# Patient Record
Sex: Female | Born: 1980 | Race: White | Hispanic: Yes | Marital: Married | Smoking: Never smoker
Health system: Southern US, Community
[De-identification: ages and names within clinical notes are randomized; demographics above are authoritative.]

## PROBLEM LIST (undated history)

## (undated) DIAGNOSIS — R1012 Left upper quadrant pain: Secondary | ICD-10-CM

## (undated) DIAGNOSIS — K219 Gastro-esophageal reflux disease without esophagitis: Secondary | ICD-10-CM

## (undated) HISTORY — PX: TUBAL LIGATION: SHX77

## (undated) HISTORY — PX: CHOLECYSTECTOMY: SHX55

## (undated) HISTORY — DX: Left upper quadrant pain: R10.12

---

## 2005-03-11 ENCOUNTER — Inpatient Hospital Stay (HOSPITAL_COMMUNITY): Admission: AD | Admit: 2005-03-11 | Discharge: 2005-03-13 | Payer: Self-pay | Admitting: Obstetrics and Gynecology

## 2005-04-19 ENCOUNTER — Emergency Department (HOSPITAL_COMMUNITY): Admission: EM | Admit: 2005-04-19 | Discharge: 2005-04-19 | Payer: Self-pay | Admitting: Emergency Medicine

## 2005-04-21 ENCOUNTER — Inpatient Hospital Stay (HOSPITAL_COMMUNITY): Admission: RE | Admit: 2005-04-21 | Discharge: 2005-04-22 | Payer: Self-pay | Admitting: Emergency Medicine

## 2005-04-21 ENCOUNTER — Encounter (INDEPENDENT_AMBULATORY_CARE_PROVIDER_SITE_OTHER): Payer: Self-pay | Admitting: General Surgery

## 2006-04-22 ENCOUNTER — Observation Stay (HOSPITAL_COMMUNITY): Admission: AD | Admit: 2006-04-22 | Discharge: 2006-04-22 | Payer: Self-pay | Admitting: Obstetrics and Gynecology

## 2006-08-25 ENCOUNTER — Inpatient Hospital Stay (HOSPITAL_COMMUNITY): Admission: AD | Admit: 2006-08-25 | Discharge: 2006-08-26 | Payer: Self-pay | Admitting: Obstetrics and Gynecology

## 2008-02-21 ENCOUNTER — Emergency Department (HOSPITAL_COMMUNITY): Admission: EM | Admit: 2008-02-21 | Discharge: 2008-02-21 | Payer: Self-pay | Admitting: Emergency Medicine

## 2011-01-05 NOTE — Consult Note (Signed)
NAMEPAULLETTE, MCKAIN NO.:  [id number]   MEDICAL RECORD NO.:  [id number]          PATIENT TYPE:  OIB   LOCATION:  LDR2                          FACILITY:  [facility]   PHYSICIAN:  Tilda Burrow, M.D. DATE OF BIRTH:  02/11/1981   DATE OF CONSULTATION:  04/22/2006  DATE OF DISCHARGE:                                   CONSULTATION   OBSERVATION NOTE:  This 30 year old Hispanic female with no prenatal care to  date, is allegedly due September 03, 2006.  She presented with left-sided  abdominal pain.  Medical issues notable for gallstones diagnosed 1 years ago  and subsequently treated with cholecystectomy.  The patient is now  approximately [redacted] weeks pregnant.  Exam shows a nontender abdomen, and fetal  heart rate is reactive.  Cervical exam by nurse shows cervix to be closed  and normal.  The pain is in the left side and goes around into the back.  Urinalysis is completely normal.  Ultrasound shows normal bowel peristalsis,  no evidence of hydroureter on either side, no suspicion of ureteral  dilation.  Patient vomited x2 then felt better.   IMPRESSION:  Gastroenteritis  Follow up one week, [facility].  Rx: Phenergan Tabs25mg  x 10      Tilda Burrow, M.D.  Electronically Signed     JVF/MEDQ  D:  04/22/2006  T:  04/22/2006  Job:  [id number]

## 2011-01-05 NOTE — Op Note (Signed)
NAMEMARICELA, SCHREUR NO.:  [id number]   MEDICAL RECORD NO.:  [id number]          PATIENT TYPE:  INP   LOCATION:  A413                          FACILITY:  [facility]   PHYSICIAN:  Tilda Burrow, M.D. DATE OF BIRTH:  02-13-1981   DATE OF PROCEDURE:  08/25/2006  DATE OF DISCHARGE:                                PROCEDURE NOTE   DELIVERY NOTE.   [name] progressed beautifully through the evening.  She received 1  mg of Stadol for headache and otherwise labored spontaneously without  interventions.  She reached completely dilated shortly before 6 a.m.  Developed the urge to push at approximately 6:15, had membrane rupture  revealing a small amount of amniotic fluid, only a few tablespoons, and  then delivered, after about 6 pushes, delivering a healthy female  infant, Apgars 9/9, over an intact perineum with only first degree  lacerations noted.  Placenta delivered intact, Schultze presentation.  Three vessel cord confirmed.  Blood samples obtained from baby.  Blood  gas declined, blood gas not considered necessary.      Tilda Burrow, M.D.  Electronically Signed     JVF/MEDQ  D:  08/25/2006  T:  08/25/2006  Job:  [id number]   cc:   Francoise Schaumann. Milford Cage DO, FAAP  Fax: [phone]

## 2011-01-05 NOTE — Op Note (Signed)
[name], Summer Hughes               ACCOUNT NO.:  [id number]   MEDICAL RECORD NO.:  [id number]          PATIENT TYPE:  OBV   LOCATION:  A428                          FACILITY:  [facility]   PHYSICIAN:  Barbaraann Barthel, M.D. DATE OF BIRTH:  [birth year]   DATE OF PROCEDURE:  04/21/2005  DATE OF DISCHARGE:                                 OPERATIVE REPORT   PREOPERATIVE DIAGNOSIS:  Cholecystitis, cholelithiasis   POSTOPERATIVE DIAGNOSIS:  Cholecystitis, cholelithiasis   PROCEDURE:  Laparoscopic cholecystectomy.   SPECIMEN:  Gallbladder with stones.   NOTE:  This is a 30 year old [name] who presented with acute  gallbladder like symptoms with nausea and vomiting and right upper quadrant  pain. She was seen in the emergency room with elevated white count and  returned for sonogram which revealed multiple stones. Liver function studies  and amylase were within normal limits. She was admitted. She was taken to  surgery after explaining to her in detail in Spanish the complications not  limited to but including bleeding, infection, damage to bile duct,  perforation of organs and transitory diarrhea. Informed consent was  obtained.   GROSS OPERATIVE FINDINGS:  Adhesions about the gallbladder. Multiple small  stones within the gallbladder. The right upper quadrant otherwise appeared  to be normal. Cholangiogram was not performed.   TECHNIQUE:  The patient was placed in the supine position. After the  adequate administration of general anesthesia via endotracheal intubation,  her entire was prepped with Betadine solution and draped in the usual  manner. Foley catheter was aseptically inserted. A periumbilical incision  was carried out on the superior aspect of the umbilicus. The fascia was  visualized and elevated with  sharp towel clip and with the patient in  Trendelenburg. A Veress needle was inserted and confirmed in position with a  saline drop test. The abdomen was then insufflated  with approximately 3.5  liters of CO2, and 11-mm cannula was then inserted using the Visiport  technique, and then under direct vision, another 11-mm cannula was placed in  the epigastrium, and two 5-mm cannulas were placed in the right upper  quadrant laterally. The gallbladder was grasped. Its adhesions were taken  down. The cystic duct was clearly visualized, triply silver clipped and  divided as was the cystic artery. The gallbladder was then removed from the  liver bed using a hook cautery device. We then checked for hemostasis and  elected to leave a piece of Surgicel in the liver bed and then placed a  Jackson-Pratt drain in the liver bed. This was exited through the lateral  most 5-mm cannula site. The incision in the epigastrium and the umbilicus  were closed with 0 Polysorb sutures, and 0.5% Sensorcaine was used in all  port sites to help with postoperative comfort. The skin incisions were then  closed with  stapling device, and the drain was sutured in place with 3-0 nylon. Prior to  closure, all sponge, needle and instrument counts were found to be correct.  Estimated blood loss was minimal. The patient received a liter of  crystalloids intraoperatively. There were no complications.  Barbaraann Barthel, M.D.  Electronically Signed     WB/MEDQ  D:  04/21/2005  T:  04/21/2005  Job:  [id number]

## 2011-01-05 NOTE — Consult Note (Signed)
Summer Hughes, Summer Hughes NO.:  [id number]   MEDICAL RECORD NO.:  [id number]          PATIENT TYPE:  OBV   LOCATION:  A428                          FACILITY:  [facility]   PHYSICIAN:  Barbaraann Barthel, M.D. DATE OF BIRTH:  09/16/1980   DATE OF CONSULTATION:  04/20/2005  DATE OF DISCHARGE:                                   CONSULTATION   Surgery was asked to see this 30 year old Summer Hughes female who was admitted  through the emergency room with right upper quadrant pain and nausea. She  gave the history of having right upper quadrant pain and nausea in the past;  at least six months ago, she had similar type symptoms. She was seen in the  emergency room yesterday, and she was returned after sonogram revealed  multiple gallstones. Surgery was consulted, and the patient was admitted via  the emergency room for observation and for surgery during this admission.   PHYSICAL EXAMINATION:  GENERAL:  She weighs 140 pounds. She is approximately  5 foot 1 inch tall. Her temperature is 98.0, blood pressure 123/64, heart  rate 68, respirations 20 per minute.  HEENT:  Head is normocephalic. Eyes:  Extraocular movements are intact.  Pupils are round and reactive to light and accommodation. There is no  conjunctival pallor or scleral injection. Sclerae are a normal tincture.  Nose and oral mucosa are moist.  NECK:  Supple and cylindrical without jugular venous distention,  thyromegaly, or tracheal deviation. No bruits are auscultated. No cervical  adenopathy.  CHEST:  Clear both to anterior and posterior auscultation.  HEART:  Regular rhythm.  BREASTS:  Without masses. The patient is still breast feeding her 52-month-  old child.  ABDOMEN:  Tender over the right upper quadrant. Bowel sounds are  normoactive. No femoral or inguinal hernias are appreciated.  RECTAL:  Guaiac negative stool.  EXTREMITIES:  Without normal limits.   REVIEW OF SYSTEMS:  ENDOCRINE:  No history of diabetes or  thyroid disease.  OB/GYN HISTORY:  She is a gravida 2, para 2, abortus 0, Cesarean 0 female  who is still breast feeding her 33-month-old infant. No past history of  carcinoma of the breast in the family. GASTROINTESTINAL:  Nausea, vomiting,  right upper quadrant post prandial pain. She had this back in [place]. It was  thought that she might have an ulcer at that time. She was treated  symptomatically for this. She, however, underwent no endoscopy and no other  diagnostic studies. No past history of hepatitis. No past history of bright  red rectal bleeding, black tarry stools, or explained weight loss.  GENITOURINARY:  No history of nephrolithiasis or dysuria. MUSCULOSKELETAL:  Within normal limits.   LABORATORY DATA:  The patient had a white count of 13,000 in the emergency  room yesterday. White count is down to 7.9 with a H and H of 11.5 and 33.6  with 54 neutrophils noted, down from 76% yesterday. Metabolic 7 is grossly  within normal limits. Bilirubin and alkaline phosphatase are within normal  limits. AST is 30, within normal limits; SGOT is also within normal limits,  and amylase and lipase are not elevated. Sonogram shows numerous stones  within the gallbladder. The gallbladder wall did not appear to be thickened.  No fluid was discovered. No other abnormalities. Extensive cholelithiasis  was noted.   ASSESSMENT:  We discussed the need for admission and for surgery in this  patient in Spanish in detail. We discussed complications not limited to but  including bleeding, infection, damage to bile ducts, perforation of organs,  transitory diarrhea, and the possibility that an open cholecystectomy may be  required. Informed consent was obtained. All questions were answered in  Spanish, and informed consent was obtained.   PLAN:  This patient will be admitted, hydrated. We will permit full liquid  diet and n.p.o. after midnight and continue Rocephin and plan for surgery in   a.m.      Barbaraann Barthel, M.D.  Electronically Signed     WB/MEDQ  D:  04/20/2005  T:  04/20/2005  Job:  [id number]   cc:   Colon Branch, M.D.  Emergency room

## 2011-01-05 NOTE — Group Therapy Note (Signed)
NAMEMAHREEN, SCHEWE NO.:  [id number]   MEDICAL RECORD NO.:  [id number]          PATIENT TYPE:  INP   LOCATION:  LDR1                          FACILITY:  [facility]   PHYSICIAN:  Tilda Burrow, M.D. DATE OF BIRTH:  29-Dec-1980   DATE OF PROCEDURE:  DATE OF DISCHARGE:                                   PROGRESS NOTE   DELIVERY SUMMARY  Onset of labor is March 12, 2005 at 5:30 a.m.   Date of delivery:  July 24 at 11:30 a.m.   Length of first stage of labor:  5 minutes.  Length of second stage labor:  36 minutes.  Length of third stage labor:  8 minutes.   DELIVERY NOTE:  Chantea had a normal spontaneous delivery of a female infant.  Upon delivery of head, a nuchal cord was noted, which was loosened.  There  was a mild shoulder dystocia which resolved easily with _Mc_ Su Hilt  procedure and a wood screw procedure.  Infant was rotated to the patient's  left with resolution and spontaneous delivery of the infant.  Upon delivery,  the infant was thoroughly suction, dried and passed off to the nursery  nurses for newborn care.  The infant had terminal meconium at delivery.  The  third stage of labor was actively managed with 20 units Pitocin, 1000 mL of  D5 LR at a rapid rate.  Upon inspection, perineum is noted to be intact.  The placenta was delivered spontaneously via Schultz's mechanism.  A three  vessel cord is noted upon inspection.  Membranes were noted to be intact  upon inspection.  Infant Apgars were at 7 and 9.  Cord blood gas was  obtained and sent to respiratory therapy.  Infant and mother both stabilized  and transferred out to the postpartum unit in stable condition.  The  perineum was noted to be intact upon inspection.       DL/MEDQ  D:  29/52/8413  T:  03/12/2005  Job:  [id number]

## 2011-01-05 NOTE — H&P (Signed)
Summer Hughes, Summer Hughes NO.:  [id number]   MEDICAL RECORD NO.:  [id number]          PATIENT TYPE:  INP   LOCATION:  A413                          FACILITY:  [facility]   PHYSICIAN:  Tilda Burrow, M.D. DATE OF BIRTH:  1981/01/13   DATE OF ADMISSION:  08/24/2006  DATE OF DISCHARGE:  LH                              HISTORY & PHYSICAL   ADMITTING DIAGNOSIS:  Pregnancy term, active labor.   HPI:  This is a 30 year old Hispanic female gravid 3, para 1, AB 1 with  late prenatal care seen in a total of 6 visits with uncomplicated  pregnancy course to date.  Presents early on August 25, 2006, shortly  after midnight (complaining of active labor).  Cervical dilation to 3-4  cm is noted.  She is admitted for labor management.  Membranes are  intact.  Fetal heart rate tracing is reactive.   PRENATAL LABS:  Blood type O positive, Rubella immunity present.  Hemoglobin 12, hematocrit 36.  Hepatitis, HIV, RPR, GC and Chlamydia all  negative and Group B Strep is negative.  She is positive for HSV I and  II, was given samples in December of Valtrex.  __GBS  __ negative and glucose tolerance test 115 mg%.  She is admitted for  labor management.   PHYSICAL EXAM:  Weight 155, blood pressure 122/78.  Height 5 feet 0  inches.  Term sized fetus, vertex presentation.   PLAN:  Good prognosis for vaginal delivery.      Tilda Burrow, M.D.  Electronically Signed     JVF/MEDQ  D:  08/25/2006  T:  08/25/2006  Job:  [id number]   cc:   Francoise Schaumann. Milford Cage DO, FAAP  Fax: [phone]

## 2011-01-05 NOTE — Discharge Summary (Signed)
Summer Hughes, Summer Hughes               ACCOUNT NO.:  [id number]   MEDICAL RECORD NO.:  [id number]          PATIENT TYPE:  INP   LOCATION:  A428                          FACILITY:  [facility]   PHYSICIAN:  Barbaraann Barthel, M.D. DATE OF BIRTH:  [birth year]   DATE OF ADMISSION:  04/20/2005  DATE OF DISCHARGE:  09/03/2006LH                                 DISCHARGE SUMMARY   DIAGNOSIS:  Acute cholecystitis.   PROCEDURE:  On April 21, 2005, laparoscopic cholecystectomy.   HOSPITAL COURSE:  Note this is a 30 year old [name] who was admitted  with acute appendicitis after being seen in the emergency room initially and  followed up there and was noted to have multiple stones within the  gallbladder and increased abdominal pain. She was admitted late Friday and  operated on on Saturday morning the following day. A laparoscopic procedure  was carried out, this was uneventful, she had numerous small stones within  the gallbladder, the gallbladder was actually minimally inflamed however it  did have some adhesions about it. The laparoscopic procedure was carried out  uneventfully. The patient postoperatively did quite well, her diet and  activity was advanced as tolerated, there was minimal JP drainage and she  was discharged on the first postoperative day on April 22, 2005. At the  time of discharge she was tolerating p.o. well, she had no shortness of  breath or leg pain, she had no dysuria and her wound was clean without signs  of infection.   LABORATORY DATA:  Ultrasound showed extensive cholelithiasis; the  gallbladder wall was not thickened. Her liver function studies were grossly  within normal limits; postoperatively as well her liver function studies  were grossly within normal limits. Her white count was 13.3 on admission; at  time of discharge her white count was 7.2 with an H&H of 11.5 and 33.7. Her  liver function studies were within normal limits.   DISCHARGE INSTRUCTIONS:   She is discharged on a full liquid and soft diet.  She is excused from work. She is permitted to go indoors and outdoors  without problem. She is excused from work. She is told to do no heavy  lifting and she is to refrain from doing any heavy lifting or sexual  activity. She may continue to breastfeed. She is discharged on Tylenol for  pain and if that is not sufficient Darvocet was given one tablet p.o. q.4 h.  p.r.n. She is told stay away from aspirin products. She is told to clean her  wound with alcohol three times a day. We have made follow-up arrangements to  see her perioperatively. She is told if she has any acute changes to come to  the emergency room or to call me.      Barbaraann Barthel, M.D.  Electronically Signed     WB/MEDQ  D:  04/22/2005  T:  04/22/2005  Job:  [id number]

## 2012-12-29 ENCOUNTER — Other Ambulatory Visit (HOSPITAL_COMMUNITY): Payer: Self-pay | Admitting: General Surgery

## 2012-12-29 DIAGNOSIS — R102 Pelvic and perineal pain: Secondary | ICD-10-CM

## 2013-01-01 ENCOUNTER — Ambulatory Visit (HOSPITAL_COMMUNITY)
Admission: RE | Admit: 2013-01-01 | Discharge: 2013-01-01 | Disposition: A | Discharge status: Home / Self Care | Payer: Self-pay | Source: Ambulatory Visit | Attending: General Surgery | Admitting: General Surgery

## 2013-01-01 DIAGNOSIS — N949 Unspecified condition associated with female genital organs and menstrual cycle: Secondary | ICD-10-CM | POA: Insufficient documentation

## 2013-01-01 DIAGNOSIS — R102 Pelvic and perineal pain: Secondary | ICD-10-CM

## 2013-03-24 ENCOUNTER — Encounter (HOSPITAL_COMMUNITY): Payer: Self-pay | Admitting: *Deleted

## 2013-03-24 ENCOUNTER — Emergency Department (HOSPITAL_COMMUNITY)
Admission: EM | Admit: 2013-03-24 | Discharge: 2013-03-24 | Disposition: A | Discharge status: Home / Self Care | Payer: Managed Care, Other (non HMO) | Attending: Emergency Medicine | Admitting: Emergency Medicine

## 2013-03-24 ENCOUNTER — Emergency Department (HOSPITAL_COMMUNITY): Payer: Managed Care, Other (non HMO)

## 2013-03-24 DIAGNOSIS — R0602 Shortness of breath: Secondary | ICD-10-CM | POA: Insufficient documentation

## 2013-03-24 DIAGNOSIS — R209 Unspecified disturbances of skin sensation: Secondary | ICD-10-CM | POA: Insufficient documentation

## 2013-03-24 DIAGNOSIS — Z3202 Encounter for pregnancy test, result negative: Secondary | ICD-10-CM | POA: Insufficient documentation

## 2013-03-24 DIAGNOSIS — R079 Chest pain, unspecified: Secondary | ICD-10-CM | POA: Insufficient documentation

## 2013-03-24 DIAGNOSIS — R5381 Other malaise: Secondary | ICD-10-CM | POA: Insufficient documentation

## 2013-03-24 DIAGNOSIS — T413X5A Adverse effect of local anesthetics, initial encounter: Secondary | ICD-10-CM | POA: Insufficient documentation

## 2013-03-24 LAB — BASIC METABOLIC PANEL
BUN: 10 mg/dL (ref 6–23)
Chloride: 102 mEq/L (ref 96–112)
Creatinine, Ser: 0.48 mg/dL — ABNORMAL LOW (ref 0.50–1.10)
GFR calc Af Amer: >90 mL/min (ref >90)
Glucose, Bld: 116 mg/dL — ABNORMAL HIGH (ref 70–99)

## 2013-03-24 LAB — URINALYSIS, ROUTINE W REFLEX MICROSCOPIC
Bilirubin Urine: NEGATIVE
Ketones, ur: NEGATIVE mg/dL
Protein, ur: NEGATIVE mg/dL

## 2013-03-24 LAB — CBC WITH DIFFERENTIAL/PLATELET
Basophils Absolute: 0 10*3/uL (ref 0.0–0.1)
Basophils Relative: 0 % (ref 0–1)
HCT: 36.1 % (ref 36.0–46.0)
Hemoglobin: 12.3 g/dL (ref 12.0–15.0)
Lymphocytes Relative: 37 % (ref 12–46)
Lymphs Abs: 4 10*3/uL (ref 0.7–4.0)
MCH: 29.5 pg (ref 26.0–34.0)
MCV: 86.6 fL (ref 78.0–100.0)
Neutrophils Relative %: 57 % (ref 43–77)
Platelets: 233 10*3/uL (ref 150–400)
RDW: 12.9 % (ref 11.5–15.5)
WBC: 10.8 10*3/uL — ABNORMAL HIGH (ref 4.0–10.5)

## 2013-03-24 MED ORDER — IBUPROFEN 400 MG PO TABS
400.0000 mg | ORAL_TABLET | Freq: Once | ORAL | Status: AC
  Administered 2013-03-24: 400 mg via ORAL
  Filled 2013-03-24: qty 1

## 2013-03-24 NOTE — ED Notes (Signed)
Patient given discharge instruction, verbalized understand. IV removed, band aid applied. Patient ambulatory out of the department with husband.  

## 2013-03-24 NOTE — ED Notes (Signed)
Pt received from EMS via [facility] Department secondary to heart palpitations due to lidocaine administration during a dental procedure.

## 2013-03-24 NOTE — ED Provider Notes (Signed)
CSN: [id number]     Arrival date & time 03/24/13  1638 History  This chart was scribed for Flint Melter, MD by Bennett Scrape, ED Scribe. This patient was seen in room APA05/APA05 and the patient's care was started at 5:18 PM.   Chief Complaint  Patient presents with  . Palpitations  . Allergic Reaction    Patient is a 32 y.o. female presenting with palpitations. The history is provided by the patient. A language interpreter was used (pt's husband ).  Palpitations Timing:  Constant Progression:  Resolved Chronicity:  New Context comment:  Dental surgery Associated symptoms: chest pain   Associated symptoms: no vomiting     HPI Comments: Summer Hughes is a 32 y.o. female brought in by ambulance, who presents to the Emergency Department complaining of heart palpitations with associated SOB and fatigue that started after getting a lidocaine injection during a dental procedure. She states that she had one tooth pulled from the right upper jaw today with minimal bleeding. She c/o mild right arm numbness and left CP currently. She rates the pain a 6 out of 10. Pt denies having prior episodes of similar symptoms. She denies being on any daily medications. She denies being on birth control pills. LNMP was 03/05/13.  PMFH: None  History  Substance Use Topics  . Smoking status: Never Smoker   . Smokeless tobacco: Not on file  . Alcohol Use: No   No OB history provided.  Review of Systems  Constitutional: Positive for fatigue.  HENT: Positive for dental problem.   Cardiovascular: Positive for chest pain and palpitations.  Gastrointestinal: Negative for vomiting and diarrhea.  All other systems reviewed and are negative.    Allergies  Review of patient's allergies indicates no known allergies.  Home Medications  No current outpatient prescriptions on file.  Triage Vitals: BP 103/62  Pulse 80  Temp(Src) 99.4 F (37.4 C) (Oral)  Ht 5' (1.524 m)  Wt 161 lb (73.029 kg)  BMI  31.44 kg/m2  SpO2 100%  Physical Exam  Nursing note and vitals reviewed. Constitutional: She is oriented to person, place, and time. She appears well-developed and well-nourished.  HENT:  Head: Normocephalic and atraumatic.  Right upper first molar dental extraction site has a mild amount of blooding, no trismus   Eyes: Conjunctivae and EOM are normal. Pupils are equal, round, and reactive to light.  Neck: Normal range of motion and phonation normal. Neck supple.  Cardiovascular: Normal rate, regular rhythm and intact distal pulses.   Pulmonary/Chest: Effort normal and breath sounds normal. She exhibits no tenderness.  Abdominal: Soft. She exhibits no distension. There is no tenderness. There is no guarding.  Musculoskeletal: Normal range of motion.  Neurological: She is alert and oriented to person, place, and time. She has normal strength. She exhibits normal muscle tone.  Skin: Skin is warm and dry.  Psychiatric: She has a normal mood and affect. Her behavior is normal. Judgment and thought content normal.    ED Course   Procedures (including critical care time)  Medications  ibuprofen (ADVIL,MOTRIN) tablet 400 mg (400 mg Oral Given 03/24/13 1813)   Patient Vitals for the past 24 hrs:  BP Temp Temp src Pulse SpO2 Height Weight  03/24/13 1759 111/70 mmHg - - 75 - - -  03/24/13 1757 111/63 mmHg - - 78 - - -  03/24/13 1755 105/62 mmHg - - 79 - - -  03/24/13 1643 103/62 mmHg 99.4 F (37.4 C) Oral 80 100 %  5' (1.524 m) 161 lb (73.029 kg)   DIAGNOSTIC STUDIES: Oxygen Saturation is 100% on room air, normal by my interpretation.    COORDINATION OF CARE: 5:23 PM-Discussed treatment plan which includes CXR, CBC panel and CMP with pt at bedside and pt agreed to plan.  7:11 PM-Pt rechecked and feels improved with medications listed above. Informed pt of negative work up. Discussed discharge plan with pt and pt agreed to plan. Also advised pt to follow up as needed and pt agreed.  Addressed symptoms to return for with pt.    Date: 03/24/2013  Rate: 78  Rhythm: normal sinus rhythm  QRS Axis: normal  Intervals: normal  ST/T Wave abnormalities: normal  Conduction Disutrbances:none  Narrative Interpretation:   Old EKG Reviewed: none available  Results for orders placed during the hospital encounter of 03/24/13  URINALYSIS, ROUTINE W REFLEX MICROSCOPIC      Result Value Range   Color, Urine YELLOW  YELLOW   APPearance CLEAR  CLEAR   Specific Gravity, Urine 1.015  1.005 - 1.030   pH >9.0 (*) 5.0 - 8.0   Glucose, UA NEGATIVE  NEGATIVE mg/dL   Hgb urine dipstick NEGATIVE  NEGATIVE   Bilirubin Urine NEGATIVE  NEGATIVE   Ketones, ur NEGATIVE  NEGATIVE mg/dL   Protein, ur NEGATIVE  NEGATIVE mg/dL   Urobilinogen, UA 0.2  0.0 - 1.0 mg/dL   Nitrite NEGATIVE  NEGATIVE   Leukocytes, UA NEGATIVE  NEGATIVE  CBC WITH DIFFERENTIAL      Result Value Range   WBC 10.8 (*) 4.0 - 10.5 K/uL   RBC 4.17  3.87 - 5.11 MIL/uL   Hemoglobin 12.3  12.0 - 15.0 g/dL   HCT 40.9  81.1 - 91.4 %   MCV 86.6  78.0 - 100.0 fL   MCH 29.5  26.0 - 34.0 pg   MCHC 34.1  30.0 - 36.0 g/dL   RDW 78.2  95.6 - 21.3 %   Platelets 233  150 - 400 K/uL   Neutrophils Relative % 57  43 - 77 %   Lymphocytes Relative 37  12 - 46 %   Monocytes Relative 5  3 - 12 %   Eosinophils Relative 1  0 - 5 %   Basophils Relative 0  0 - 1 %   Neutro Abs 6.2  1.7 - 7.7 K/uL   Lymphs Abs 4.0  0.7 - 4.0 K/uL   Monocytes Absolute 0.5  0.1 - 1.0 K/uL   Eosinophils Absolute 0.1  0.0 - 0.7 K/uL   Basophils Absolute 0.0  0.0 - 0.1 K/uL   Smear Review MORPHOLOGY UNREMARKABLE    BASIC METABOLIC PANEL      Result Value Range   Sodium 137  135 - 145 mEq/L   Potassium 3.4 (*) 3.5 - 5.1 mEq/L   Chloride 102  96 - 112 mEq/L   CO2 25  19 - 32 mEq/L   Glucose, Bld 116 (*) 70 - 99 mg/dL   BUN 10  6 - 23 mg/dL   Creatinine, Ser 0.86 (*) 0.50 - 1.10 mg/dL   Calcium 9.6  8.4 - 57.8 mg/dL   GFR calc non Af Amer >90  >90 mL/min    GFR calc Af Amer >90  >90 mL/min  PREGNANCY, URINE      Result Value Range   Preg Test, Ur NEGATIVE  NEGATIVE   Dg Chest 2 View  03/24/2013   *RADIOLOGY REPORT*  Clinical Data: Palpitations.  Allergic reaction today.  CHEST - 2 VIEW  Comparison: Portable examination 02/21/2008.  Findings: The heart size and mediastinal contours are stable.  The lungs are well aerated and clear.  There is no pleural effusion or pneumothorax.  No acute osseous findings are demonstrated.  IMPRESSION: No active cardiopulmonary process.   Original Report Authenticated By: Carey Bullocks, M.D.    1. Chest pain     MDM  Nonspecific chest pain, post dental procedure. No evidence for significant allergic reaction, acute bleeding, pneumonia, pneumothorax, or suspected PE.Doubt metabolic instability, serious bacterial infection or impending vascular collapse; the patient is stable for discharge.  Nursing Notes Reviewed/ Care Coordinated, and agree without changes. Applicable Imaging Reviewed.  Interpretation of Laboratory Data incorporated into ED treatment   Plan: Home Medications-  ibuprofen ; Home Treatments and Observation-  rest, watch for progressive symptoms ; return here if the recommended treatment, does not improve the symptoms; Recommended follow up-  return here if needed for problems     I personally performed the services described in this documentation, which was scribed in my presence. The recorded information has been reviewed and is accurate.      Flint Melter, MD 03/25/13 [id number]

## 2014-03-26 ENCOUNTER — Emergency Department (HOSPITAL_COMMUNITY)
Admission: EM | Admit: 2014-03-26 | Discharge: 2014-03-26 | Disposition: A | Discharge status: Home / Self Care | Payer: BC Managed Care – PPO | Attending: Emergency Medicine | Admitting: Emergency Medicine

## 2014-03-26 ENCOUNTER — Emergency Department (HOSPITAL_COMMUNITY): Payer: BC Managed Care – PPO

## 2014-03-26 ENCOUNTER — Encounter (HOSPITAL_COMMUNITY): Payer: Self-pay | Admitting: Emergency Medicine

## 2014-03-26 DIAGNOSIS — R0602 Shortness of breath: Secondary | ICD-10-CM | POA: Insufficient documentation

## 2014-03-26 DIAGNOSIS — R42 Dizziness and giddiness: Secondary | ICD-10-CM | POA: Insufficient documentation

## 2014-03-26 DIAGNOSIS — F411 Generalized anxiety disorder: Secondary | ICD-10-CM | POA: Insufficient documentation

## 2014-03-26 DIAGNOSIS — R209 Unspecified disturbances of skin sensation: Secondary | ICD-10-CM | POA: Insufficient documentation

## 2014-03-26 DIAGNOSIS — R079 Chest pain, unspecified: Secondary | ICD-10-CM | POA: Insufficient documentation

## 2014-03-26 DIAGNOSIS — R110 Nausea: Secondary | ICD-10-CM | POA: Insufficient documentation

## 2014-03-26 LAB — CBC WITH DIFFERENTIAL/PLATELET
BASOS PCT: 0 % (ref 0–1)
Basophils Absolute: 0 10*3/uL (ref 0.0–0.1)
EOS PCT: 2 % (ref 0–5)
Eosinophils Absolute: 0.1 10*3/uL (ref 0.0–0.7)
HCT: 37.9 % (ref 36.0–46.0)
HEMOGLOBIN: 12.9 g/dL (ref 12.0–15.0)
LYMPHS ABS: 2.8 10*3/uL (ref 0.7–4.0)
Lymphocytes Relative: 44 % (ref 12–46)
MCH: 29.7 pg (ref 26.0–34.0)
MCHC: 34 g/dL (ref 30.0–36.0)
MCV: 87.1 fL (ref 78.0–100.0)
MONO ABS: 0.3 10*3/uL (ref 0.1–1.0)
MONOS PCT: 5 % (ref 3–12)
Neutro Abs: 3 10*3/uL (ref 1.7–7.7)
Neutrophils Relative %: 49 % (ref 43–77)
PLATELETS: 257 10*3/uL (ref 150–400)
RBC: 4.35 MIL/uL (ref 3.87–5.11)
RDW: 13 % (ref 11.5–15.5)
WBC: 6.2 10*3/uL (ref 4.0–10.5)

## 2014-03-26 LAB — BASIC METABOLIC PANEL
Anion gap: 15 (ref 5–15)
BUN: 6 mg/dL (ref 6–23)
CO2: 25 meq/L (ref 19–32)
Calcium: 9.4 mg/dL (ref 8.4–10.5)
Chloride: 101 mEq/L (ref 96–112)
Creatinine, Ser: 0.51 mg/dL (ref 0.50–1.10)
GFR calc Af Amer: >90 mL/min (ref >90)
GLUCOSE: 92 mg/dL (ref 70–99)
POTASSIUM: 3.6 meq/L — AB (ref 3.7–5.3)
SODIUM: 141 meq/L (ref 137–147)

## 2014-03-26 LAB — I-STAT TROPONIN, ED: Troponin i, poc: 0 ng/mL (ref 0.00–0.08)

## 2014-03-26 MED ORDER — ASPIRIN 325 MG PO TABS
325.0000 mg | ORAL_TABLET | Freq: Once | ORAL | Status: AC
  Administered 2014-03-26: 325 mg via ORAL
  Filled 2014-03-26: qty 1

## 2014-03-26 MED ORDER — LORAZEPAM 1 MG PO TABS
1.0000 mg | ORAL_TABLET | Freq: Once | ORAL | Status: AC
  Administered 2014-03-26: 1 mg via ORAL
  Filled 2014-03-26: qty 1

## 2014-03-26 NOTE — ED Notes (Signed)
Woke up this am with chest pain at  4am, having SOB.  Rates pain 9.

## 2014-03-26 NOTE — Discharge Instructions (Signed)
Dolor de pecho (no específico) °(Chest Pain (Nonspecific)) °Suele ser difícil diagnosticar la causa del dolor de pecho. Siempre hay una posibilidad de que el dolor podría estar relacionado con algo grave, como un ataque al corazón o un coágulo sanguíneo en los pulmones. Debe concurrir a las visitas de control con el médico. °CUIDADOS EN EL HOGAR °· Si le dieron antibióticos, tómelos como se lo haya indicado el médico. Finalice el medicamento, aunque comience a sentirse mejor. °· Durante los días siguientes, no haga actividades que provoquen dolor de pecho. Continúe con las actividades físicas como se lo haya indicado el médico. °· No use productos que contengan tabaco, que incluyen cigarrillos, tabaco para mascar y cigarrillos electrónicos. °· Evite el consumo de alcohol. °· Tome los medicamentos solamente como se lo haya indicado el médico. °· Siga las sugerencias del médico en lo que respecta a más pruebas, si el dolor de pecho no desaparece. °· Concurra a todas las visitas que concertó con el médico. °SOLICITE AYUDA SI: °· El dolor de pecho no desaparece, incluso después del tratamiento. °· Tiene una erupción cutánea con ampollas en el pecho. °· Tiene fiebre. °SOLICITE AYUDA DE INMEDIATO SI:  °· Aumenta el dolor de pecho o el dolor se irradia hacia el brazo, el cuello, la mandíbula, la espalda o el vientre (abdomen). °· Le falta el aire. °· Tose más de lo normal o tose con sangre. °· Siente un dolor muy intenso en la espalda o el vientre. °· Tiene malestar estomacal (náuseas) o vomita. °· Se siente muy débil. °· Pierde el conocimiento (se desmaya). °· Tiene escalofríos. °Esto es una emergencia. No espere a ver que los problemas desaparezcan. Llame a los servicios de emergencia locales (911 en los [place]). No conduzca por sus propios medios hasta el hospital. °ASEGÚRESE DE QUE:  °· Comprende estas instrucciones. °· Controlará su afección. °· Recibirá ayuda de inmediato si no mejora o si empeora. °Document  Released: 11/02/2008 Document Revised: 08/11/2013 °ExitCare® Patient Information ©2015 [employer]. This information is not intended to replace advice given to you by your health care provider. Make sure you discuss any questions you have with your health care provider. ° °

## 2014-03-26 NOTE — ED Notes (Signed)
Interpreter used for discharge instructions.   

## 2014-03-26 NOTE — ED Provider Notes (Signed)
CSN: [id number]     Arrival date & time 03/26/14  1141 History   This chart was scribed for Joya Gaskins, MD by Jarvis Morgan, ED Scribe. This patient was seen in room APA03/APA03 and the patient's care was started at 12:20 PM.    Chief Complaint  Patient presents with  . Chest Pain      Patient is a 33 y.o. female presenting with chest pain. The history is provided by the patient. A language interpreter was used (# [id number]).  Chest Pain Pain location:  L chest Pain quality: pressure and radiating   Pain radiates to:  L arm Pain radiates to the back: no   Pain severity:  Moderate Onset quality:  Sudden Duration:  8 hours Timing:  Constant Progression:  Unchanged Chronicity:  New Context: at rest   Relieved by:  Nothing Worsened by:  Exertion and movement Ineffective treatments:  None tried Associated symptoms: anxiety, dizziness, nausea, numbness (left arm) and shortness of breath   Associated symptoms: no abdominal pain, no back pain, no cough, no diaphoresis, no headache, no lower extremity edema, no syncope, not vomiting and no weakness   Risk factors: no coronary artery disease, no diabetes mellitus, no high cholesterol, no hypertension, no immobilization, not obese, no prior DVT/PE and no smoking     HPI Comments: Summer Hughes is a 33 y.o. female with no pertinent medical history who presents to the Emergency Department complaining of constant, moderate, left sided, "pressure like", "9/10" chest pain onset 8 hours ago. Patient states the "numbing" pain radiates to the left arm. She states she is having associated nausea, dizziness, anxiety, and SOB. She states the pain is exacerbated by exertion. She denies use of any medications on a regular basis. She states she has had this kind of chest pain before. She denies any family or personal history of MI or DVT. Denies any recent travel. Denies any fever, abdominal pain, diaphoresis, emesis, cough, back pain, HA or lower  extremity edema.   PMH - none fam hx -negative for CAD History  Substance Use Topics  . Smoking status: Never Smoker   . Smokeless tobacco: Not on file  . Alcohol Use: No   OB History   Grav Para Term Preterm Abortions TAB SAB Ect Mult Living                 Review of Systems  Constitutional: Negative for diaphoresis.  Respiratory: Positive for shortness of breath. Negative for cough.   Cardiovascular: Positive for chest pain. Negative for syncope.  Gastrointestinal: Positive for nausea. Negative for vomiting and abdominal pain.  Musculoskeletal: Negative for back pain.  Neurological: Positive for dizziness and numbness (left arm). Negative for weakness and headaches.  Psychiatric/Behavioral: The patient is nervous/anxious.   All other systems reviewed and are negative.     Allergies  Review of patient's allergies indicates no known allergies.  Home Medications   Prior to Admission medications   Not on File   Triage Vitals: BP 115/74  Pulse 73  Temp(Src) 98.7 F (37.1 C) (Oral)  Resp 20  Ht 5\' 5"  (1.651 m)  Wt 155 lb (70.308 kg)  BMI 25.79 kg/m2  SpO2 99%  Physical Exam CONSTITUTIONAL: Well developed/well nourished HEAD: Normocephalic/atraumatic EYES: EOMI/PERRL ENMT: Mucous membranes moist NECK: supple no meningeal signs SPINE:entire spine nontender CV: S1/S2 noted, no murmurs/rubs/gallops noted Chest - mild tenderness to palpation, no crepitus noted LUNGS: Lungs are clear to auscultation bilaterally, no apparent distress ABDOMEN: soft, nontender,  no rebound or guarding GU:no cva tenderness NEURO: Pt is awake/alert, moves all extremitiesx4 EXTREMITIES: pulses normal, full ROM. No calf tenderness or edema SKIN: warm, color normal PSYCH: no abnormalities of mood noted  ED Course  Procedures   DIAGNOSTIC STUDIES: Oxygen Saturation is 99% on RA, normal by my interpretation.    COORDINATION OF CARE: 12:28 PM- Will order aspirin, Ativan, CXR, BMP, CBC  with diff, I-stat troponin, and EKG. Pt advised of plan for treatment and pt agrees. Pt reported feeling anxious, ativan ordered  3:47 PM HEART score less than 3 Initial troponin not drawn, 3 hr troponin was done and negative (pt has had pain >8 hours) No dynamic EKG changes She is in no distress, resting comfortably watching TV I doubt ACS at this time She is well appearing I doubt PE/Dissection at this time Via phone interpreter we discussed strict return precautions Stable for d/c home BP 103/63  Pulse 76  Temp(Src) 98.1 F (36.7 C) (Oral)  Resp 11  Ht 5\' 5"  (1.651 m)  Wt 155 lb (70.308 kg)  BMI 25.79 kg/m2  SpO2 99%   Labs Review Labs Reviewed  BASIC METABOLIC PANEL - Abnormal; Notable for the following:    Potassium 3.6 (*)    All other components within normal limits  CBC WITH DIFFERENTIAL  Rosezena SensorI-STAT TROPOININ, ED    Imaging Review Dg Chest 2 View  03/26/2014   CLINICAL DATA:  Chest pain since 4 a.m.  EXAM: CHEST  2 VIEW  COMPARISON:  03/24/2013  FINDINGS: Normal mediastinum and cardiac silhouette. Normal pulmonary vasculature. No evidence of effusion, infiltrate, or pneumothorax. No acute bony abnormality.  IMPRESSION: No acute cardiopulmonary process.   Electronically Signed   By: Genevive BiStewart  Edmunds M.D.   On: 03/26/2014 12:51     EKG Interpretation   Date/Time:  Friday March 26 2014 12:00:23 EDT Ventricular Rate:  71 PR Interval:  141 QRS Duration: 93 QT Interval:  432 QTC Calculation: 469 R Axis:   70 Text Interpretation:  Sinus rhythm Low voltage, precordial leads RSR' in  V1 or V2, probably normal variant Abnormal lateral Q waves Borderline T  abnormalities, inferior leads No significant change since last tracing  Confirmed by Bebe ShaggyWICKLINE  MD, Ladine Kiper ([id number]) on 03/26/2014 12:08:03 PM      EKG Interpretation  Date/Time:  Friday March 26 2014 14:59:19 EDT Ventricular Rate:  71 PR Interval:  152 QRS Duration: 94 QT Interval:  421 QTC Calculation: 457 R  Axis:   61 Text Interpretation:  Sinus rhythm Low voltage, precordial leads RSR' in V1 or V2, probably normal variant No significant change since last tracing Confirmed by Bebe ShaggyWICKLINE  MD, Giann Obara ([id number]) on 03/26/2014 3:11:40 PM       MDM   Final diagnoses:  Chest pain, unspecified chest pain type    Nursing notes including past medical history and social history reviewed and considered in documentation Labs/vital reviewed and considered Previous records reviewed and considered xrays reviewed and considered  I personally performed the services described in this documentation, which was scribed in my presence. The recorded information has been reviewed and is accurate.        Joya Gaskinsonald W Obed Samek, MD 03/26/14 [phone]

## 2014-03-31 ENCOUNTER — Ambulatory Visit (INDEPENDENT_AMBULATORY_CARE_PROVIDER_SITE_OTHER): Payer: BC Managed Care – PPO | Admitting: Cardiology

## 2014-03-31 ENCOUNTER — Encounter: Payer: Self-pay | Admitting: Cardiology

## 2014-03-31 ENCOUNTER — Encounter: Payer: Self-pay | Admitting: *Deleted

## 2014-03-31 VITALS — BP 106/71 | HR 72 | Ht 63.0 in | Wt 155.0 lb

## 2014-03-31 DIAGNOSIS — R0789 Other chest pain: Secondary | ICD-10-CM | POA: Insufficient documentation

## 2014-03-31 DIAGNOSIS — R072 Precordial pain: Secondary | ICD-10-CM

## 2014-03-31 MED ORDER — NITROGLYCERIN 0.4 MG SL SUBL: 0.4000 mg | SUBLINGUAL_TABLET | SUBLINGUAL | Status: DC | PRN

## 2014-03-31 NOTE — Assessment & Plan Note (Addendum)
Patient referred by Dr. Bebe ShaggyWickline after recent ER visit secondary to chest pain. Symptoms continue to be recurrent since that time. ECG abnormal but overall nonspecific, no old tracing for comparison. She has no major known cardiac risk factors based on available information, is on no prescription medications at baseline. Her chest pain symptoms have both typical and atypical features, possibly got better with nitroglycerin in the ER by her recollection, also seem to get better when she drinks cold water (question GI etiology). There is also an exertional component. She is stable today hemodynamically including normal oxygen saturation. No significant murmur or gallop on examination. We will provide a nitroglycerin prescription for now and ask her to continue aspirin pending further testing. Plan is to schedule an exercise Cardiolite for ischemic evaluation, if reassuring she will need to followup with the [facility] for further workup.

## 2014-03-31 NOTE — Progress Notes (Signed)
[facility]  Clinical Summary Summer Hughes is a Hispanic 33 y.o.female referred for cardiology consultation by Dr. Bebe Shaggy after recent ER visit. She has apparently had some primary care followup at the [facility]. She is here with her husband today who interprets for her, she does not speak [place] well.  She reports a feeling of intermittent left-sided chest pressure that has been recurrent, lasting several hours at a time, since this past Friday. She noted the symptoms at nighttime. She states that drinking cold water sometimes helps. She also notes that exertion makes the symptoms worse, and that she is somewhat more short of breath with activity. No cough or hemoptysis. No reported fevers or chills.  Recent labwork showed normal troponin I, potassium 3.6, BUN 6, creatinine 0.5, Hgb 12.9, platelets 257. ECG from August 7 showed sinus rhythm with high lateral Q waves, nonspecific ST/T changes. CXR reported no acute process.  She reports no major medical conditions or prior surgeries, takes no prescription medications, and reports no family history of premature CAD. She has undergone no prior ischemic testing.  No Known Allergies  Current Outpatient Prescriptions  Medication Sig Dispense Refill  . aspirin 325 MG tablet Take 325 mg by mouth daily as needed for headache.      . nitroGLYCERIN (NITROSTAT) 0.4 MG SL tablet Place 1 tablet (0.4 mg total) under the tongue every 5 (five) minutes x 3 doses as needed for chest pain. If no relief after 3rd dose, please proceed to the  ED for an evaluation  90 tablet  3   No current facility-administered medications for this visit.    History reviewed. No pertinent past medical history.  History reviewed. No pertinent past surgical history.  Family History  Problem Relation Age of Onset  . Healthy Father   . Healthy Mother     Social History Summer Hughes reports that she has never smoked. She does not have any  smokeless tobacco history on file. Summer Hughes reports that she does not drink alcohol.  Review of Systems Other systems reviewed and negative except as outlined.  Physical Examination Filed Vitals:   03/31/14 0828  BP: 106/71  Pulse: 72   Filed Weights   03/31/14 0828  Weight: 155 lb (70.308 kg)   Overweight woman in no distress. HEENT: Conjunctiva and lids normal, oropharynx clear. Neck: Supple, no elevated JVP or carotid bruits, no thyromegaly. Lungs: Clear to auscultation, nonlabored breathing at rest. Cardiac: Regular rate and rhythm, no S3 or significant systolic murmur, no pericardial rub. Abdomen: Soft, nontender, bowel sounds present, no guarding or rebound. Extremities: No pitting edema, distal pulses 2+. Skin: Warm and dry. Musculoskeletal: No kyphosis. Neuropsychiatric: Alert and oriented x3, affect grossly appropriate.   Problem List and Plan   Precordial pain Patient referred by Dr. Bebe Shaggy after recent ER visit secondary to chest pain. Symptoms continue to be recurrent since that time. ECG abnormal but overall nonspecific, no old tracing for comparison. She has no major known cardiac risk factors based on available information, is on no prescription medications at baseline. Her chest pain symptoms have both typical and atypical features, possibly got better with nitroglycerin in the ER by her recollection, also seem to get better when she drinks cold water (question GI etiology). There is also an exertional component. She is stable today hemodynamically including normal oxygen saturation. No significant murmur or gallop on examination. We will provide a nitroglycerin prescription for now and ask her to continue aspirin pending further testing.  Plan is to schedule a Lexiscan Cardiolite for ischemic evaluation, if reassuring she will need to followup with the [facility] for further workup.    Jonelle SidleSamuel G. Pauleen Goleman, M.D., F.A.C.C.

## 2014-03-31 NOTE — Patient Instructions (Signed)
   Your physician has requested that you have en exercise stress myoview. For further information please visit [email or web address]. Please follow instruction sheet, as given. Your physician has recommended you make the following change in your medication:  Your doctor has given you a prescription today for nitroglcyerin 0.4 mg. Please use 1 tablet under your tongue for severe chest pain every 5 minutes up to 3 doses. If no relief after the third dose, proceed to the ED for an evaluation. We will call you with your results.

## 2014-04-01 ENCOUNTER — Encounter (HOSPITAL_COMMUNITY)
Admission: RE | Admit: 2014-04-01 | Discharge: 2014-04-01 | Disposition: A | Discharge status: Home / Self Care | Payer: BC Managed Care – PPO | Source: Ambulatory Visit | Attending: Cardiology | Admitting: Cardiology

## 2014-04-01 ENCOUNTER — Encounter (HOSPITAL_COMMUNITY): Payer: Self-pay

## 2014-04-01 ENCOUNTER — Ambulatory Visit (HOSPITAL_COMMUNITY)
Admission: RE | Admit: 2014-04-01 | Discharge: 2014-04-01 | Disposition: A | Discharge status: Home / Self Care | Payer: BC Managed Care – PPO | Source: Ambulatory Visit | Attending: Cardiology | Admitting: Cardiology

## 2014-04-01 DIAGNOSIS — R079 Chest pain, unspecified: Secondary | ICD-10-CM | POA: Insufficient documentation

## 2014-04-01 DIAGNOSIS — R072 Precordial pain: Secondary | ICD-10-CM | POA: Insufficient documentation

## 2014-04-01 MED ORDER — TECHNETIUM TC 99M SESTAMIBI GENERIC - CARDIOLITE
30.0000 | Freq: Once | INTRAVENOUS | Status: AC | PRN
  Administered 2014-04-01: 30 via INTRAVENOUS

## 2014-04-01 MED ORDER — REGADENOSON 0.4 MG/5ML IV SOLN
INTRAVENOUS | Status: AC
  Filled 2014-04-01: qty 5

## 2014-04-01 MED ORDER — TECHNETIUM TC 99M SESTAMIBI - CARDIOLITE
10.0000 | Freq: Once | INTRAVENOUS | Status: AC | PRN
  Administered 2014-04-01: 10 via INTRAVENOUS

## 2014-04-01 MED ORDER — SODIUM CHLORIDE 0.9 % IJ SOLN
INTRAMUSCULAR | Status: AC
  Administered 2014-04-01: 10 mL via INTRAVENOUS
  Filled 2014-04-01: qty 10

## 2014-04-01 MED ORDER — SODIUM CHLORIDE 0.9 % IJ SOLN
10.0000 mL | INTRAMUSCULAR | Status: DC | PRN
  Administered 2014-04-01: 10 mL via INTRAVENOUS

## 2014-04-01 NOTE — Progress Notes (Signed)
Stress Lab Nurses Notes - Summer Hughes  [name] 04/01/2014 Reason for doing test: Chest Pain Type of test: Stress Cardiolite Nurse performing test: Parke PoissonPhyllis Billingsly, RN Nuclear Medicine Tech: [name] MD performing test: [name] Family [name] Test explained and consent signed: Yes.   IV started: Saline lock flushed, No redness or edema and Saline lock started in radiology Symptoms: Chest pain & SOB Treatment/Intervention: None Reason test stopped: chest pain, fatigue and reached target HR After recovery IV was: Discontinued via X-ray tech and No redness or edema Patient to return to Nuc. Med at : 11:15 Patient discharged: Home Patient's Condition upon discharge was: stable Comments:Having chest pain prior to starting test # 5. During test peak BP 145/64 & HR 181.  Recovery BP 121/75 & HR . Continues to have chest pain # 5. [name], [name]

## 2014-04-02 ENCOUNTER — Telehealth: Payer: Self-pay | Admitting: *Deleted

## 2014-04-02 NOTE — Telephone Encounter (Signed)
Patient informed. 

## 2014-04-02 NOTE — Telephone Encounter (Signed)
Message copied by Eustace MooreANDERSON, Sloane Junkin M on Fri Apr 02, 2014  9:46 AM ------      Message from: MCDOWELL, Illene BolusSAMUEL G      Created: Thu Apr 01, 2014  3:02 PM       Study reviewed. Please let her know that this test argues against major obstructive coronary blockages as a cause for her chest pain. Would recommend that she be seen by her [facility] for further evaluation of other potential causes. ------

## 2015-01-02 ENCOUNTER — Emergency Department (HOSPITAL_COMMUNITY)
Admission: EM | Admit: 2015-01-02 | Discharge: 2015-01-03 | Disposition: A | Discharge status: Home / Self Care | Payer: BLUE CROSS/BLUE SHIELD | Attending: Emergency Medicine | Admitting: Emergency Medicine

## 2015-01-02 ENCOUNTER — Encounter (HOSPITAL_COMMUNITY): Payer: Self-pay | Admitting: Emergency Medicine

## 2015-01-02 ENCOUNTER — Emergency Department (HOSPITAL_COMMUNITY): Payer: BLUE CROSS/BLUE SHIELD

## 2015-01-02 DIAGNOSIS — R509 Fever, unspecified: Secondary | ICD-10-CM

## 2015-01-02 DIAGNOSIS — Z9049 Acquired absence of other specified parts of digestive tract: Secondary | ICD-10-CM | POA: Insufficient documentation

## 2015-01-02 DIAGNOSIS — R000 Tachycardia, unspecified: Secondary | ICD-10-CM | POA: Insufficient documentation

## 2015-01-02 DIAGNOSIS — K759 Inflammatory liver disease, unspecified: Secondary | ICD-10-CM | POA: Insufficient documentation

## 2015-01-02 DIAGNOSIS — Z79899 Other long term (current) drug therapy: Secondary | ICD-10-CM | POA: Insufficient documentation

## 2015-01-02 DIAGNOSIS — Z3202 Encounter for pregnancy test, result negative: Secondary | ICD-10-CM | POA: Insufficient documentation

## 2015-01-02 DIAGNOSIS — K529 Noninfective gastroenteritis and colitis, unspecified: Secondary | ICD-10-CM | POA: Insufficient documentation

## 2015-01-02 DIAGNOSIS — R109 Unspecified abdominal pain: Secondary | ICD-10-CM

## 2015-01-02 LAB — CBC WITH DIFFERENTIAL/PLATELET
Basophils Absolute: 0 10*3/uL (ref 0.0–0.1)
Basophils Relative: 0 % (ref 0–1)
EOS PCT: 0 % (ref 0–5)
Eosinophils Absolute: 0 10*3/uL (ref 0.0–0.7)
HCT: 38.3 % (ref 36.0–46.0)
HEMOGLOBIN: 13 g/dL (ref 12.0–15.0)
LYMPHS ABS: 0.6 10*3/uL — AB (ref 0.7–4.0)
LYMPHS PCT: 7 % — AB (ref 12–46)
MCH: 29.6 pg (ref 26.0–34.0)
MCHC: 33.9 g/dL (ref 30.0–36.0)
MCV: 87.2 fL (ref 78.0–100.0)
MONOS PCT: 5 % (ref 3–12)
Monocytes Absolute: 0.4 10*3/uL (ref 0.1–1.0)
NEUTROS PCT: 88 % — AB (ref 43–77)
Neutro Abs: 8 10*3/uL — ABNORMAL HIGH (ref 1.7–7.7)
PLATELETS: 189 10*3/uL (ref 150–400)
RBC: 4.39 MIL/uL (ref 3.87–5.11)
RDW: 13.8 % (ref 11.5–15.5)
WBC: 9 10*3/uL (ref 4.0–10.5)

## 2015-01-02 LAB — URINALYSIS, ROUTINE W REFLEX MICROSCOPIC
Bilirubin Urine: NEGATIVE
Glucose, UA: NEGATIVE mg/dL
Nitrite: NEGATIVE
PH: 7 (ref 5.0–8.0)
PROTEIN: NEGATIVE mg/dL
Specific Gravity, Urine: 1.01 (ref 1.005–1.030)
Urobilinogen, UA: 0.2 mg/dL (ref 0.0–1.0)

## 2015-01-02 LAB — URINE MICROSCOPIC-ADD ON

## 2015-01-02 LAB — COMPREHENSIVE METABOLIC PANEL
ALBUMIN: 4 g/dL (ref 3.5–5.0)
ALT: 208 U/L — ABNORMAL HIGH (ref 14–54)
AST: 295 U/L — ABNORMAL HIGH (ref 15–41)
Alkaline Phosphatase: 109 U/L (ref 38–126)
Anion gap: 10 (ref 5–15)
BILIRUBIN TOTAL: 1.1 mg/dL (ref 0.3–1.2)
BUN: 10 mg/dL (ref 6–20)
CO2: 22 mmol/L (ref 22–32)
Calcium: 8.6 mg/dL — ABNORMAL LOW (ref 8.9–10.3)
Chloride: 105 mmol/L (ref 101–111)
Creatinine, Ser: 0.61 mg/dL (ref 0.44–1.00)
GFR calc Af Amer: >60 mL/min (ref >60)
GFR calc non Af Amer: >60 mL/min (ref >60)
Glucose, Bld: 104 mg/dL — ABNORMAL HIGH (ref 65–99)
POTASSIUM: 3.4 mmol/L — AB (ref 3.5–5.1)
Sodium: 137 mmol/L (ref 135–145)
Total Protein: 7.2 g/dL (ref 6.5–8.1)

## 2015-01-02 LAB — LIPASE, BLOOD: LIPASE: 22 U/L (ref 22–51)

## 2015-01-02 LAB — PREGNANCY, URINE: Preg Test, Ur: NEGATIVE

## 2015-01-02 MED ORDER — IOHEXOL 300 MG/ML  SOLN
100.0000 mL | Freq: Once | INTRAMUSCULAR | Status: AC | PRN
  Administered 2015-01-02: 100 mL via INTRAVENOUS

## 2015-01-02 MED ORDER — SODIUM CHLORIDE 0.9 % IV SOLN
1000.0000 mL | Freq: Once | INTRAVENOUS | Status: AC
  Administered 2015-01-02: 1000 mL via INTRAVENOUS

## 2015-01-02 MED ORDER — ACETAMINOPHEN 500 MG PO TABS
1000.0000 mg | ORAL_TABLET | Freq: Once | ORAL | Status: AC
  Administered 2015-01-02: 1000 mg via ORAL
  Filled 2015-01-02: qty 2

## 2015-01-02 MED ORDER — SODIUM CHLORIDE 0.9 % IV SOLN: 1000.0000 mL | INTRAVENOUS | Status: DC

## 2015-01-02 MED ORDER — ONDANSETRON HCL 4 MG/2ML IJ SOLN
4.0000 mg | Freq: Once | INTRAMUSCULAR | Status: AC
  Administered 2015-01-02: 4 mg via INTRAVENOUS
  Filled 2015-01-02: qty 2

## 2015-01-02 MED ORDER — IOHEXOL 300 MG/ML  SOLN
50.0000 mL | Freq: Once | INTRAMUSCULAR | Status: AC | PRN
  Administered 2015-01-02: 50 mL via ORAL

## 2015-01-02 MED ORDER — HYDROMORPHONE HCL 1 MG/ML IJ SOLN
1.0000 mg | INTRAMUSCULAR | Status: DC | PRN
  Administered 2015-01-02: 1 mg via INTRAVENOUS
  Filled 2015-01-02: qty 1

## 2015-01-02 MED ORDER — IOHEXOL 300 MG/ML  SOLN: 50.0000 mL | Freq: Once | INTRAMUSCULAR | Status: DC | PRN

## 2015-01-02 NOTE — ED Provider Notes (Signed)
CSN: [id number]     Arrival date & time 01/02/15  1805 History   First MD Initiated Contact with Patient 01/02/15 1847     Chief Complaint  Patient presents with  . Abdominal Pain   Patient is a 34 y.o. female presenting with abdominal pain. The history is provided by the patient.  Abdominal Pain Pain location:  LUQ and LLQ Pain quality: sharp   Pain quality comment:  And like a balloon is inflating in side Pain radiates to:  L flank Pain severity:  Severe Duration:  12 hours Timing:  Constant Progression:  Worsening Ineffective treatments:  NSAIDs Associated symptoms: anorexia, chills, fever and vomiting (7 times)   Associated symptoms: no constipation, no dysuria, no vaginal bleeding and no vaginal discharge  Diarrhea: 6 times.     History reviewed. No pertinent past medical history. Past Surgical History  Procedure Laterality Date  . Cholecystectomy     Family History  Problem Relation Age of Onset  . Healthy Father   . Healthy Mother    History  Substance Use Topics  . Smoking status: Never Smoker   . Smokeless tobacco: Not on file  . Alcohol Use: No   OB History    Gravida Para Term Preterm AB TAB SAB Ectopic Multiple Living            4     Review of Systems  Constitutional: Positive for fever and chills.  Gastrointestinal: Positive for vomiting (7 times), abdominal pain and anorexia. Negative for constipation. Diarrhea: 6 times.  Genitourinary: Negative for dysuria, vaginal bleeding and vaginal discharge.  All other systems reviewed and are negative.     Allergies  Review of patient's allergies indicates no known allergies.  Home Medications   Prior to Admission medications   Medication Sig Start Date End Date Taking? Authorizing Provider  ibuprofen (ADVIL,MOTRIN) 200 MG tablet Take 200-1,000 mg by mouth every 6 (six) hours as needed for mild pain or moderate pain.   Yes Historical Provider, MD  nitroGLYCERIN (NITROSTAT) 0.4 MG SL tablet Place 1  tablet (0.4 mg total) under the tongue every 5 (five) minutes x 3 doses as needed for chest pain. If no relief after 3rd dose, please proceed to the  ED for an evaluation 03/31/14  Yes Jonelle SidleSamuel G McDowell, MD  HYDROcodone-acetaminophen (NORCO/VICODIN) 5-325 MG per tablet Take 1-2 tablets by mouth every 4 (four) hours as needed. 01/03/15   Linwood DibblesJon Shantrice Rodenberg, MD  ondansetron (ZOFRAN) 4 MG tablet Take 1 tablet (4 mg total) by mouth every 6 (six) hours. 01/03/15   Linwood DibblesJon Raequon Catanzaro, MD   BP 105/67 mmHg  Pulse 116  Temp(Src) 100.6 F (38.1 C) (Oral)  Resp 20  Ht 5\' 3"  (1.6 m)  Wt 162 lb (73.483 kg)  BMI 28.70 kg/m2  SpO2 98%  LMP 12/04/2014 Physical Exam  Constitutional: She appears well-developed and well-nourished. No distress.  HENT:  Head: Normocephalic and atraumatic.  Right Ear: External ear normal.  Left Ear: External ear normal.  Eyes: Conjunctivae are normal. Right eye exhibits no discharge. Left eye exhibits no discharge. No scleral icterus.  Neck: Neck supple. No tracheal deviation present.  Cardiovascular: Regular rhythm and intact distal pulses.  Tachycardia present.   Pulmonary/Chest: Effort normal and breath sounds normal. No stridor. No respiratory distress. She has no wheezes. She has no rales.  Abdominal: Soft. Bowel sounds are normal. She exhibits no distension and no mass. There is tenderness in the left upper quadrant and left lower quadrant. There  is guarding. There is no rebound. No hernia.  Musculoskeletal: She exhibits no edema or tenderness.  Neurological: She is alert. She has normal strength. No cranial nerve deficit (no facial droop, extraocular movements intact, no slurred speech) or sensory deficit. She exhibits normal muscle tone. She displays no seizure activity. Coordination normal.  Skin: Skin is warm and dry. No rash noted.  Psychiatric: She has a normal mood and affect.  Nursing note and vitals reviewed.   ED Course  Procedures (including critical care time) Labs  Review Labs Reviewed  CBC WITH DIFFERENTIAL/PLATELET - Abnormal; Notable for the following:    Neutrophils Relative % 88 (*)    Neutro Abs 8.0 (*)    Lymphocytes Relative 7 (*)    Lymphs Abs 0.6 (*)    All other components within normal limits  COMPREHENSIVE METABOLIC PANEL - Abnormal; Notable for the following:    Potassium 3.4 (*)    Glucose, Bld 104 (*)    Calcium 8.6 (*)    AST 295 (*)    ALT 208 (*)    All other components within normal limits  URINALYSIS, ROUTINE W REFLEX MICROSCOPIC - Abnormal; Notable for the following:    APPearance HAZY (*)    Hgb urine dipstick TRACE (*)    Ketones, ur TRACE (*)    Leukocytes, UA SMALL (*)    All other components within normal limits  URINE MICROSCOPIC-ADD ON - Abnormal; Notable for the following:    Squamous Epithelial / LPF FEW (*)    Bacteria, UA FEW (*)    All other components within normal limits  LIPASE, BLOOD  PREGNANCY, URINE  HEPATITIS PANEL, ACUTE    Imaging Review Ct Abdomen Pelvis W Contrast  01/02/2015   CLINICAL DATA:  Left lower quadrant pain with nausea and vomiting being in this morning.  EXAM: CT ABDOMEN AND PELVIS WITH CONTRAST  TECHNIQUE: Multidetector CT imaging of the abdomen and pelvis was performed using the standard protocol following bolus administration of intravenous contrast.  CONTRAST:  50mL OMNIPAQUE IOHEXOL 300 MG/ML SOLN, 100mL OMNIPAQUE IOHEXOL 300 MG/ML SOLN  COMPARISON:  None.  FINDINGS: Lung bases demonstrate a sub solid 6 mm triangular density adjacent the minor fissure on the most superior image.  Abdominal images demonstrate evidence of a previous cholecystectomy. The spleen, pancreas, liver and adrenal glands are normal. Kidneys are normal in size without hydronephrosis or nephrolithiasis. Ureters are within normal. Vascular structures are within normal. The appendix is normal.  Pelvic images demonstrate the uterus and ovaries to be within normal. Findings suggesting prior tubal ligation. The bladder  and rectum are within normal. There is no free fluid. Remaining bones soft tissues are within normal.  IMPRESSION: No acute findings in the abdomen/pelvis.  6 mm sub solid opacity adjacent the minor fissure on the most superior image. Initial follow-up by chest CT without contrast is recommended in 3 months to confirm persistence. This recommendation follows the consensus statement: Recommendations for the Management of Subsolid Pulmonary Nodules Detected at CT: A Statement from the Fleischner Society as published in Radiology 2013; 266:304-317.   Electronically Signed   By: Elberta Fortisaniel  Boyle M.D.   On: 01/02/2015 22:16    Medications  0.9 %  sodium chloride infusion (0 mLs Intravenous Stopped 01/02/15 2139)    Followed by  0.9 %  sodium chloride infusion (not administered)  0.9 %  sodium chloride infusion (1,000 mLs Intravenous New Bag/Given 01/02/15 2201)    Followed by  0.9 %  sodium chloride  infusion (0 mLs Intravenous Stopped 01/02/15 1952)    Followed by  0.9 %  sodium chloride infusion (not administered)  HYDROmorphone (DILAUDID) injection 1 mg (1 mg Intravenous Given 01/02/15 1910)  acetaminophen (TYLENOL) tablet 1,000 mg (1,000 mg Oral Given 01/02/15 1910)  ondansetron (ZOFRAN) injection 4 mg (4 mg Intravenous Given 01/02/15 1910)  iohexol (OMNIPAQUE) 300 MG/ML solution 50 mL (50 mLs Oral Contrast Given 01/02/15 2108)  iohexol (OMNIPAQUE) 300 MG/ML solution 100 mL (100 mLs Intravenous Contrast Given 01/02/15 2137)     MDM   Final diagnoses:  Fever  Abdominal pain  Gastroenteritis  Hepatitis    Pt does have elevated lfts.  ?hepatitis.  Will send off hepatitis panel  CT scan does not show any acute findings.  Possible viral illness.  Pt improved with treatment in the ED.  Follow up with PCP closely 1-2 days.    Linwood Dibbles, MD 01/03/15 [phone]

## 2015-01-02 NOTE — ED Notes (Signed)
PT c/o LLQ abdominal pain with n/v starting at 0600 this am. PT denies any urinary symptoms and states taking 1000mg  of ibuprofen at 1200.

## 2015-01-02 NOTE — ED Notes (Addendum)
Pt ambulatory to restroom with assistance, tolerated well, update given on plan of care,

## 2015-01-02 NOTE — ED Notes (Signed)
Pt c/o left lower abd pain with n/v that started this am, Dr Lynelle DoctorKnapp in prior to RN, see edp assessment for further,

## 2015-01-03 LAB — HEPATITIS PANEL, ACUTE
HCV AB: NEGATIVE
HEP B S AG: NEGATIVE
Hep A IgM: NONREACTIVE
Hep B C IgM: NONREACTIVE

## 2015-01-03 MED ORDER — HYDROCODONE-ACETAMINOPHEN 5-325 MG PO TABS: 1.0000 | ORAL_TABLET | ORAL | Status: DC | PRN

## 2015-01-03 MED ORDER — ONDANSETRON HCL 4 MG PO TABS: 4.0000 mg | ORAL_TABLET | Freq: Four times a day (QID) | ORAL | Status: DC

## 2015-01-03 NOTE — Discharge Instructions (Signed)
Viral Gastroenteritis °Viral gastroenteritis is also known as stomach flu. This condition affects the stomach and intestinal tract. It can cause sudden diarrhea and vomiting. The illness typically lasts 3 to 8 days. Most people develop an immune response that eventually gets rid of the virus. While this natural response develops, the virus can make you quite ill. °CAUSES  °Many different viruses can cause gastroenteritis, such as rotavirus or noroviruses. You can catch one of these viruses by consuming contaminated food or water. You may also catch a virus by sharing utensils or other personal items with an infected person or by touching a contaminated surface. °SYMPTOMS  °The most common symptoms are diarrhea and vomiting. These problems can cause a severe loss of body fluids (dehydration) and a body salt (electrolyte) imbalance. Other symptoms may include: °· Fever. °· Headache. °· Fatigue. °· Abdominal pain. °DIAGNOSIS  °Your caregiver can usually diagnose viral gastroenteritis based on your symptoms and a physical exam. A stool sample may also be taken to test for the presence of viruses or other infections. °TREATMENT  °This illness typically goes away on its own. Treatments are aimed at rehydration. The most serious cases of viral gastroenteritis involve vomiting so severely that you are not able to keep fluids down. In these cases, fluids must be given through an intravenous line (IV). °HOME CARE INSTRUCTIONS  °· Drink enough fluids to keep your urine clear or pale yellow. Drink small amounts of fluids frequently and increase the amounts as tolerated. °· Ask your caregiver for specific rehydration instructions. °· Avoid: °¨ Foods high in sugar. °¨ Alcohol. °¨ Carbonated drinks. °¨ Tobacco. °¨ Juice. °¨ Caffeine drinks. °¨ Extremely hot or cold fluids. °¨ Fatty, greasy foods. °¨ Too much intake of anything at one time. °¨ Dairy products until 24 to 48 hours after diarrhea stops. °· You may consume probiotics.  Probiotics are active cultures of beneficial bacteria. They may lessen the amount and number of diarrheal stools in adults. Probiotics can be found in yogurt with active cultures and in supplements. °· Wash your hands well to avoid spreading the virus. °· Only take over-the-counter or prescription medicines for pain, discomfort, or fever as directed by your caregiver. Do not give aspirin to children. Antidiarrheal medicines are not recommended. °· Ask your caregiver if you should continue to take your regular prescribed and over-the-counter medicines. °· Keep all follow-up appointments as directed by your caregiver. °SEEK IMMEDIATE MEDICAL CARE IF:  °· You are unable to keep fluids down. °· You do not urinate at least once every 6 to 8 hours. °· You develop shortness of breath. °· You notice blood in your stool or vomit. This may look like coffee grounds. °· You have abdominal pain that increases or is concentrated in one small area (localized). °· You have persistent vomiting or diarrhea. °· You have a fever. °· The patient is a child younger than 3 months, and he or she has a fever. °· The patient is a child older than 3 months, and he or she has a fever and persistent symptoms. °· The patient is a child older than 3 months, and he or she has a fever and symptoms suddenly get worse. °· The patient is a baby, and he or she has no tears when crying. °MAKE SURE YOU:  °· Understand these instructions. °· Will watch your condition. °· Will get help right away if you are not doing well or get worse. °Document Released: 08/06/2005 Document Revised: 10/29/2011 Document Reviewed: 05/23/2011 °  ExitCare Patient Information 2015 [employer], [place]. This information is not intended to replace advice given to you by your health care provider. Make sure you discuss any questions you have with your health care provider. Gastroenteritis viral (Viral Gastroenteritis) La gastroenteritis viral tambin es conocida como gripe del  [place]. Este trastorno Performance Food Groupafecta el estmago y el tubo digestivo. Puede causar diarrea y vmitos repentinos. La enfermedad generalmente dura entre 3 y [address] das. La Harley-Davidsonmayora de las personas desarrolla una respuesta inmunolgica. Con el tiempo, esto elimina el virus. Mientras se desarrolla esta respuesta natural, el virus puede afectar en forma importante su salud.  CAUSAS Muchos virus diferentes pueden causar gastroenteritis, por ejemplo el rotavirus o el norovirus. Estos virus pueden contagiarse al consumir alimentos o agua contaminados. Tambin puede contagiarse al compartir utensilios u otros artculos personales con una persona infectada o al tocar una superficie contaminada.  SNTOMAS Los sntomas ms comunes son diarrea y vmitos. Estos problemas pueden causar una prdida grave de lquidos corporales(deshidratacin) y un desequilibrio de sales corporales(electrolitos). Otros sntomas pueden ser:   Grant RutsFiebre.  Dolor de Turkmenistancabeza.  Fatiga.  Dolor abdominal. DIAGNSTICO  El mdico podr hacer el diagnstico de gastroenteritis viral basndose en los sntomas y el examen fsico Tambin pueden tomarle una muestra de materia fecal para diagnosticar la presencia de virus u otras infecciones.  TRATAMIENTO Esta enfermedad generalmente desaparece sin tratamiento. Los tratamientos estn dirigidos a Social research officer, governmentla rehidratacin. Los casos ms graves de gastroenteritis viral implican vmitos tan intensos que no es posible retener lquidos. En Franklin Resourcesestos casos, los lquidos deben administrarse a travs de una va intravenosa (IV).  INSTRUCCIONES PARA EL CUIDADO DOMICILIARIO  Beba suficientes lquidos para mantener la orina clara o de color amarillo plido. Beba pequeas cantidades de lquido con frecuencia y aumente la cantidad segn la tolerancia.  Pida instrucciones especficas a su mdico con respecto a la rehidratacin.  Evite:  Alimentos que Nurse, adulttengan mucha azcar.  Alcohol.  Gaseosas.  TabacoVista Lawman.  Jugos.  Bebidas con  cafena.  Lquidos muy calientes o fros.  Alimentos muy grasos.  Comer demasiado a Licensed conveyancerla vez.  Productos lcteos hasta 24 a 48 horas despus de que se detenga la diarrea.  Puede consumir probiticos. Los probiticos son cultivos activos de bacterias beneficiosas. Pueden disminuir la cantidad y el nmero de deposiciones diarreicas en el adulto. Se encuentran en los yogures con cultivos activos y en los suplementos.  Lave bien sus manos para evitar que se disemine el virus.  Slo tome medicamentos de venta libre o recetados para Primary school teachercalmar el dolor, las molestias o bajar la fiebre segn las indicaciones de su mdico. No administre aspirina a los nios. Los medicamentos antidiarreicos no son recomendables.  Consulte a su mdico si puede seguir tomando sus medicamentos recetados o de H. J. Heinzventa libre.  Cumpla con todas las visitas de control, segn le indique su mdico. SOLICITE ATENCIN MDICA DE INMEDIATO SI:  No puede retener lquidos.  No hay emisin de orina durante 6 a 8 horas.  Le falta el aire.  Observa sangre en el vmito (se ve como caf molido) o en la materia fecal.  Siente dolor abdominal que empeora o se concentra en una zona pequea (se localiza).  Tiene nuseas o vmitos persistentes.  Tiene fiebre.  El paciente es un nio menor de 3 meses y [place]iatiene fiebre.  El paciente es un nio mayor de 3 meses, tiene fiebre y sntomas persistentes.  El paciente es un nio mayor de 3 meses y tiene fiebre y sntomas que empeoran repentinamente.  El paciente es un bebé y no tiene lágrimas cuando llora. °ASEGÚRESE QUE:  °· Comprende estas instrucciones. °· Controlará su enfermedad. °· Solicitará ayuda inmediatamente si no mejora o si empeora. °Document Released: 08/06/2005 Document Revised: 10/29/2011 °ExitCare® Patient Information ©2015 ExitCare, LLC. This information is not intended to replace advice given to you by your health care provider. Make sure you discuss any questions you have with  your health care provider. ° °

## 2015-12-15 ENCOUNTER — Ambulatory Visit: Payer: Self-pay | Admitting: Physician Assistant

## 2015-12-19 ENCOUNTER — Encounter: Payer: Self-pay | Admitting: Physician Assistant

## 2016-05-13 IMAGING — CR DG CHEST 2V
2 series · 2 of 2 positions shown · non-contrast
Comparison: 03/24/2013

CLINICAL DATA: Chest pain since 4 a.m.

EXAM:
CHEST  2 VIEW

[view not recorded (1 of 2)]
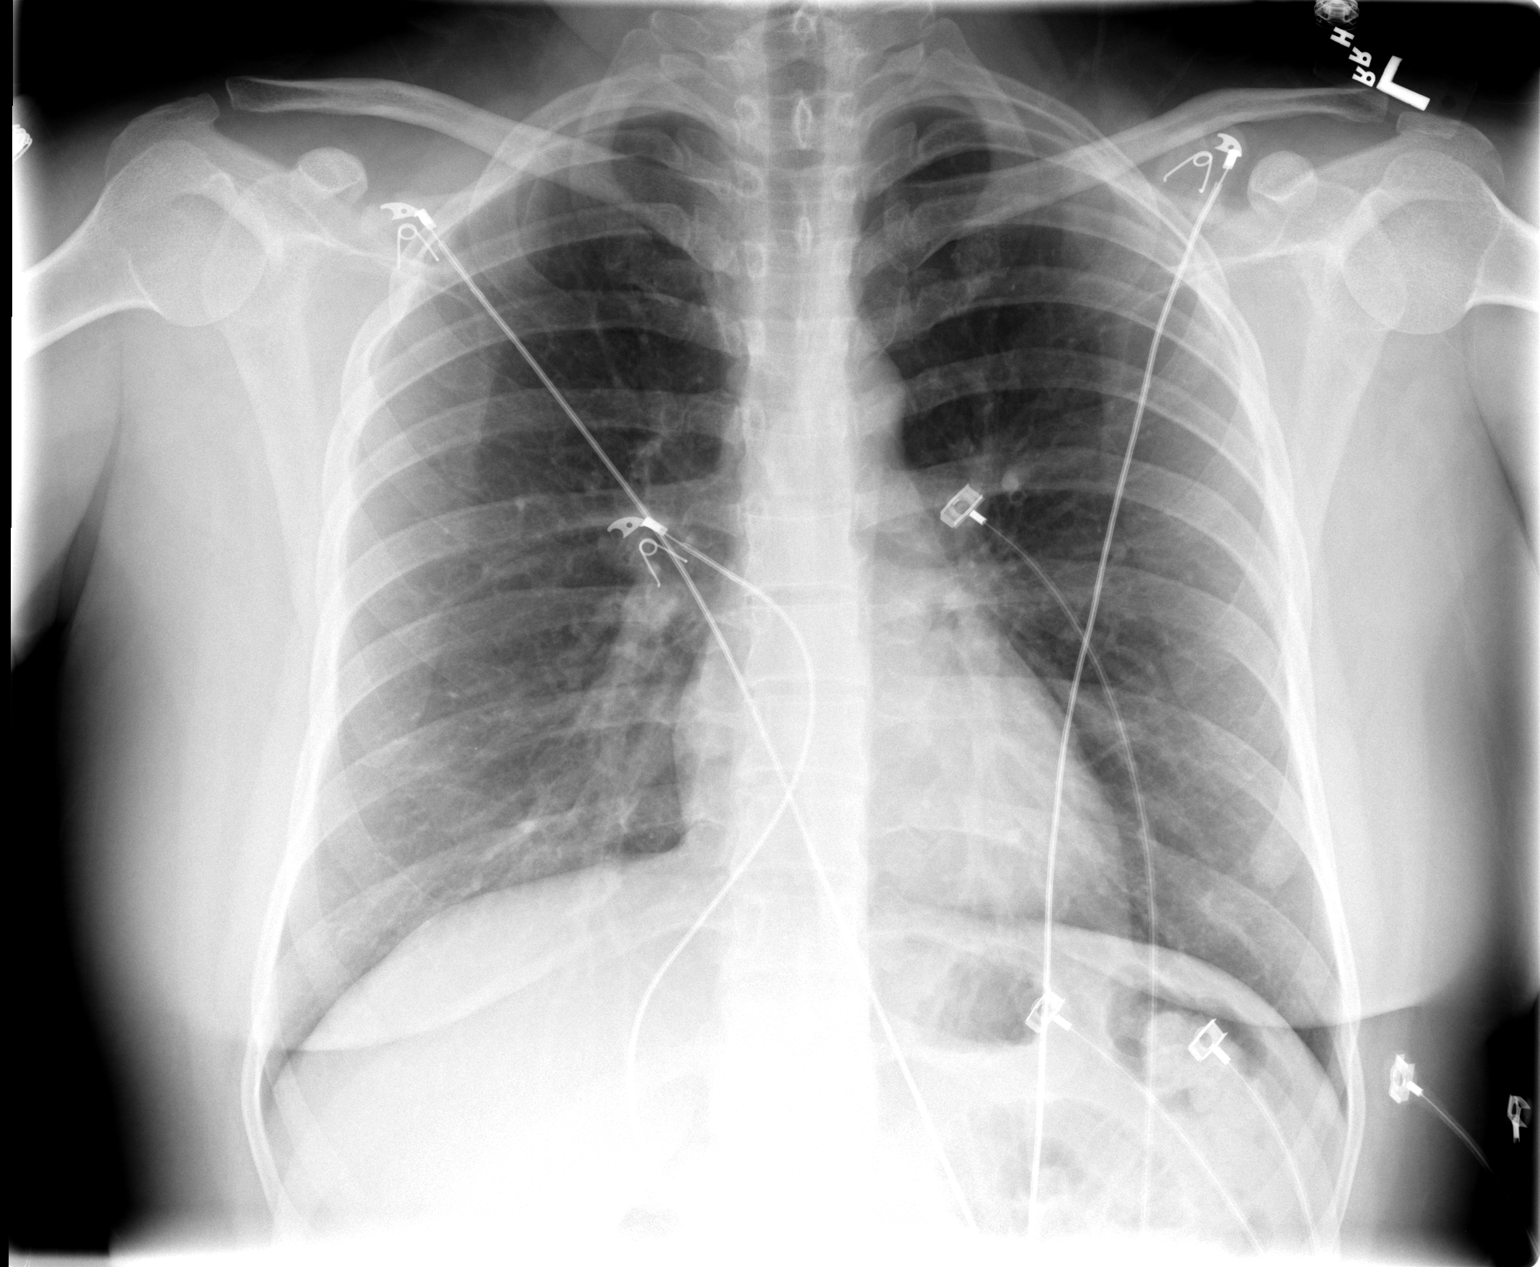

[view not recorded (2 of 2)]
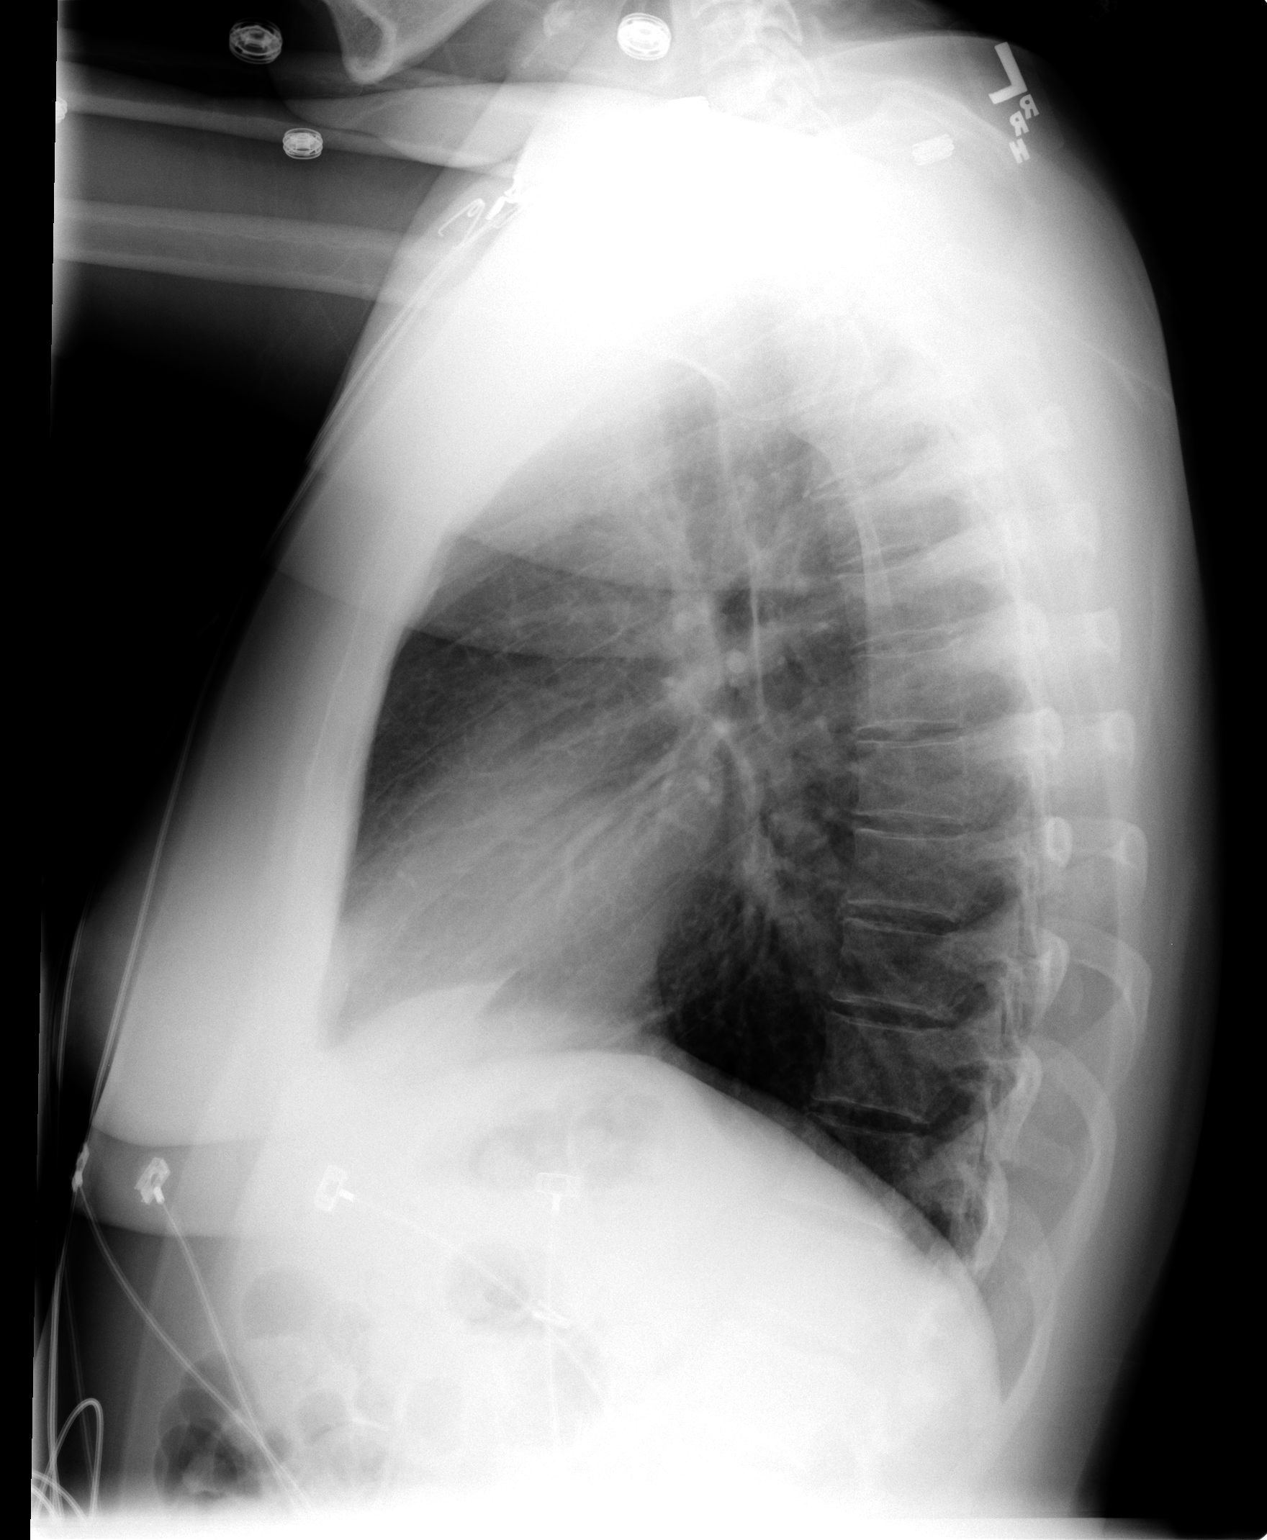

[2 of 2 positions shown; findings below may reference images not displayed]

FINDINGS: Normal mediastinum and cardiac silhouette. Normal pulmonary
vasculature. No evidence of effusion, infiltrate, or pneumothorax.
No acute bony abnormality.
IMPRESSION: No acute cardiopulmonary process.

## 2017-02-19 ENCOUNTER — Encounter (INDEPENDENT_AMBULATORY_CARE_PROVIDER_SITE_OTHER): Payer: Self-pay | Admitting: Internal Medicine

## 2017-02-19 ENCOUNTER — Encounter (INDEPENDENT_AMBULATORY_CARE_PROVIDER_SITE_OTHER): Payer: Self-pay

## 2017-03-06 ENCOUNTER — Ambulatory Visit (INDEPENDENT_AMBULATORY_CARE_PROVIDER_SITE_OTHER): Payer: BLUE CROSS/BLUE SHIELD | Admitting: Internal Medicine

## 2017-03-06 ENCOUNTER — Encounter (INDEPENDENT_AMBULATORY_CARE_PROVIDER_SITE_OTHER): Payer: Self-pay | Admitting: Internal Medicine

## 2017-03-06 ENCOUNTER — Encounter (INDEPENDENT_AMBULATORY_CARE_PROVIDER_SITE_OTHER): Payer: Self-pay | Admitting: *Deleted

## 2017-03-06 VITALS — BP 108/74 | HR 74 | Temp 98.1°F | Ht 61.0 in | Wt 160.3 lb

## 2017-03-06 DIAGNOSIS — R1010 Upper abdominal pain, unspecified: Secondary | ICD-10-CM

## 2017-03-06 LAB — CBC WITH DIFFERENTIAL/PLATELET
BASOS PCT: 0 %
Basophils Absolute: 0 cells/uL (ref 0–200)
Eosinophils Absolute: 152 cells/uL (ref 15–500)
Eosinophils Relative: 2 %
HEMATOCRIT: 37.1 % (ref 35.0–45.0)
Hemoglobin: 12.2 g/dL (ref 11.7–15.5)
Lymphocytes Relative: 46 %
Lymphs Abs: 3496 cells/uL (ref 850–3900)
MCH: 29.5 pg (ref 27.0–33.0)
MCHC: 32.9 g/dL (ref 32.0–36.0)
MCV: 89.8 fL (ref 80.0–100.0)
MONO ABS: 380 {cells}/uL (ref 200–950)
MPV: 10.3 fL (ref 7.5–12.5)
Monocytes Relative: 5 %
Neutro Abs: 3572 cells/uL (ref 1500–7800)
Neutrophils Relative %: 47 %
PLATELETS: 286 10*3/uL (ref 140–400)
RBC: 4.13 MIL/uL (ref 3.80–5.10)
RDW: 13.8 % (ref 11.0–15.0)
WBC: 7.6 10*3/uL (ref 3.8–10.8)

## 2017-03-06 NOTE — Progress Notes (Signed)
   Subjective:    Patient ID: Summer Hughes, female    DOB: 05/25/1981, 36 y.o.   MRN: [id number] Husband in room and is interpreting.  HPI  Referred by Dr. Sudie BaileyKnowlton for abdominal pain, bloating She has epigastric pain and bloating. Symptoms.  Occurs after eating meat, or a fatty meal, or drinking milk.  Red meats bother her. The Omeprazole helped a little bit per husband.  There is no pain during this office visit. She says if pressure is applied she will have pain. Her appetite is good. No weight loss.  LMP 4 day and was normal.   Review of Systems History reviewed. No pertinent past medical history.  Past Surgical History:  Procedure Laterality Date  . CHOLECYSTECTOMY    . TUBAL LIGATION      No Known Allergies  Current Outpatient Prescriptions on File Prior to Visit  Medication Sig Dispense Refill  . ibuprofen (ADVIL,MOTRIN) 200 MG tablet Take 200-1,000 mg by mouth every 6 (six) hours as needed for mild pain or moderate pain.     No current facility-administered medications on file prior to visit.         Objective:   Physical Exam Blood pressure 108/74, pulse 74, temperature 98.1 F (36.7 C), height 5\' 1"  (1.549 m), weight 160 lb 4.8 oz (72.7 kg). Alert and oriented. Skin warm and dry. Oral mucosa is moist.   . Sclera anicteric, conjunctivae is pink. Thyroid not enlarged. No cervical lymphadenopathy. Lungs clear. Heart regular rate and rhythm.  Abdomen is soft. Bowel sounds are positive. No hepatomegaly. No abdominal masses felt. No tenderness.  No edema to lower extremities.           Assessment & Plan:  Epigastric pain. Am going to get a HIDA scan, CBC, Hepatic function. Further recommendations to follow.

## 2017-03-06 NOTE — Patient Instructions (Signed)
Labs and HIDA scan Continue the Omeprazole

## 2017-03-07 LAB — HEPATIC FUNCTION PANEL
ALK PHOS: 70 U/L (ref 33–115)
ALT: 29 U/L (ref 6–29)
AST: 24 U/L (ref 10–30)
Albumin: 4.2 g/dL (ref 3.6–5.1)
BILIRUBIN DIRECT: 0 mg/dL (ref <=0.2)
BILIRUBIN INDIRECT: 0.2 mg/dL (ref 0.2–1.2)
Total Bilirubin: 0.2 mg/dL (ref 0.2–1.2)
Total Protein: 7 g/dL (ref 6.1–8.1)

## 2017-03-11 ENCOUNTER — Ambulatory Visit (HOSPITAL_COMMUNITY)
Admission: RE | Admit: 2017-03-11 | Discharge: 2017-03-11 | Disposition: A | Discharge status: Home / Self Care | Payer: BLUE CROSS/BLUE SHIELD | Source: Ambulatory Visit | Attending: Internal Medicine | Admitting: Internal Medicine

## 2017-03-11 ENCOUNTER — Encounter (HOSPITAL_COMMUNITY): Payer: Self-pay

## 2017-03-11 ENCOUNTER — Other Ambulatory Visit (INDEPENDENT_AMBULATORY_CARE_PROVIDER_SITE_OTHER): Payer: Self-pay | Admitting: Internal Medicine

## 2017-03-11 DIAGNOSIS — R1010 Upper abdominal pain, unspecified: Secondary | ICD-10-CM

## 2017-03-11 DIAGNOSIS — Z9049 Acquired absence of other specified parts of digestive tract: Secondary | ICD-10-CM | POA: Insufficient documentation

## 2017-03-11 DIAGNOSIS — R1013 Epigastric pain: Secondary | ICD-10-CM | POA: Insufficient documentation

## 2017-03-11 MED ORDER — TECHNETIUM TC 99M MEBROFENIN IV KIT
5.0000 | PACK | Freq: Once | INTRAVENOUS | Status: AC | PRN
  Administered 2017-03-11: 5.11 via INTRAVENOUS

## 2017-10-02 ENCOUNTER — Other Ambulatory Visit: Payer: Self-pay | Admitting: Family Medicine

## 2017-10-02 ENCOUNTER — Other Ambulatory Visit (HOSPITAL_COMMUNITY): Payer: Self-pay | Admitting: Family Medicine

## 2017-10-02 DIAGNOSIS — R1012 Left upper quadrant pain: Secondary | ICD-10-CM

## 2017-10-07 ENCOUNTER — Ambulatory Visit (HOSPITAL_COMMUNITY)
Admission: RE | Admit: 2017-10-07 | Discharge: 2017-10-07 | Disposition: A | Discharge status: Home / Self Care | Payer: BLUE CROSS/BLUE SHIELD | Source: Ambulatory Visit | Attending: Family Medicine | Admitting: Family Medicine

## 2017-10-07 DIAGNOSIS — R1012 Left upper quadrant pain: Secondary | ICD-10-CM

## 2017-10-07 DIAGNOSIS — Z9049 Acquired absence of other specified parts of digestive tract: Secondary | ICD-10-CM | POA: Diagnosis not present

## 2017-10-07 DIAGNOSIS — K760 Fatty (change of) liver, not elsewhere classified: Secondary | ICD-10-CM | POA: Insufficient documentation

## 2017-10-21 ENCOUNTER — Encounter (INDEPENDENT_AMBULATORY_CARE_PROVIDER_SITE_OTHER): Payer: Self-pay | Admitting: Internal Medicine

## 2017-10-21 ENCOUNTER — Ambulatory Visit (INDEPENDENT_AMBULATORY_CARE_PROVIDER_SITE_OTHER): Payer: BLUE CROSS/BLUE SHIELD | Admitting: Internal Medicine

## 2017-10-21 DIAGNOSIS — R1012 Left upper quadrant pain: Secondary | ICD-10-CM | POA: Diagnosis not present

## 2017-10-21 DIAGNOSIS — R1010 Upper abdominal pain, unspecified: Secondary | ICD-10-CM | POA: Diagnosis not present

## 2017-10-21 HISTORY — DX: Left upper quadrant pain: R10.12

## 2017-10-21 NOTE — Patient Instructions (Signed)
CT abdomen with CM.  

## 2017-10-21 NOTE — Progress Notes (Signed)
   Subjective:    Patient ID: Summer Hughes, female    DOB: 08/24/1980, 37 y.o.   MRN: [id number]  HPI PCP Dr. Sudie BaileyKnowlton. Referred for LUQ pain. Pain Left upper abdomen radiating into back for 2 weeks.  She tells me today she does not have any pain today. If she presses on her LUQ she has pain. Sometimes the pain radiates into her back.   Bloating.  Pain worse after eating. The pian occurs after eating. She says the Omeprazole is helping.  No nausea, vomiting, diarrhea, fever or chills.  No melena or BRRB. She underwent an US abdomen which was normal. GB absent. CBC 5mm.  Her appetite is good. No weight loss. She has gained 8 pounds since July of 2018. BMs move normal. No melena or BRRB. She does have constipation at times.      Review of Systems Past Medical History:  Diagnosis Date  . Abdominal pain, left upper quadrant 10/21/2017    Past Surgical History:  Procedure Laterality Date  . CHOLECYSTECTOMY    . TUBAL LIGATION      No Known Allergies  Current Outpatient Medications on File Prior to Visit  Medication Sig Dispense Refill  . omeprazole (PRILOSEC) 40 MG capsule Take 40 mg by mouth daily.     No current facility-administered medications on file prior to visit.        Objective:   Physical Exam Blood pressure 100/72, pulse 72, temperature 98.5 F (36.9 C), height 5\' 2"  (1.575 m), weight 168 lb 1.6 oz (76.2 kg). Alert and oriented. Skin warm and dry. Oral mucosa is moist.   . Sclera anicteric, conjunctivae is pink. Thyroid not enlarged. No cervical lymphadenopathy. Lungs clear. Heart regular rate and rhythm.  Abdomen is soft. Bowel sounds are positive. No hepatomegaly. No abdominal masses felt  Tenderness under her Left rib s. .  No edema to lower extremities.          Assessment & Plan:  LUQ pain. Am going to get CT abodmen with CM. Further recommendations to follow.

## 2017-10-31 ENCOUNTER — Ambulatory Visit (HOSPITAL_COMMUNITY)
Admission: RE | Admit: 2017-10-31 | Discharge: 2017-10-31 | Disposition: A | Discharge status: Home / Self Care | Payer: BLUE CROSS/BLUE SHIELD | Source: Ambulatory Visit | Attending: Internal Medicine | Admitting: Internal Medicine

## 2017-10-31 DIAGNOSIS — R1010 Upper abdominal pain, unspecified: Secondary | ICD-10-CM | POA: Diagnosis not present

## 2017-10-31 DIAGNOSIS — Z9049 Acquired absence of other specified parts of digestive tract: Secondary | ICD-10-CM | POA: Insufficient documentation

## 2017-10-31 MED ORDER — IOPAMIDOL (ISOVUE-300) INJECTION 61%
100.0000 mL | Freq: Once | INTRAVENOUS | Status: AC | PRN
  Administered 2017-10-31: 100 mL via INTRAVENOUS

## 2019-10-19 ENCOUNTER — Ambulatory Visit (HOSPITAL_COMMUNITY)
Admission: RE | Admit: 2019-10-19 | Discharge: 2019-10-19 | Disposition: A | Discharge status: Home / Self Care | Payer: BC Managed Care – PPO | Source: Ambulatory Visit | Attending: Nurse Practitioner | Admitting: Nurse Practitioner

## 2019-10-19 ENCOUNTER — Other Ambulatory Visit: Payer: Self-pay

## 2019-10-19 ENCOUNTER — Other Ambulatory Visit (HOSPITAL_COMMUNITY): Payer: Self-pay | Admitting: Nurse Practitioner

## 2019-10-19 DIAGNOSIS — M25562 Pain in left knee: Secondary | ICD-10-CM | POA: Diagnosis not present

## 2020-10-26 ENCOUNTER — Other Ambulatory Visit: Payer: Self-pay | Admitting: Nurse Practitioner

## 2020-10-26 DIAGNOSIS — G8929 Other chronic pain: Secondary | ICD-10-CM

## 2020-10-26 DIAGNOSIS — R519 Headache, unspecified: Secondary | ICD-10-CM

## 2020-11-16 ENCOUNTER — Other Ambulatory Visit: Payer: Self-pay

## 2020-11-16 ENCOUNTER — Ambulatory Visit
Admission: RE | Admit: 2020-11-16 | Discharge: 2020-11-16 | Disposition: A | Discharge status: Home / Self Care | Payer: BC Managed Care – PPO | Source: Ambulatory Visit | Attending: Nurse Practitioner | Admitting: Nurse Practitioner

## 2020-11-16 DIAGNOSIS — G8929 Other chronic pain: Secondary | ICD-10-CM

## 2021-10-13 DIAGNOSIS — B351 Tinea unguium: Secondary | ICD-10-CM | POA: Diagnosis not present

## 2021-10-13 DIAGNOSIS — G43709 Chronic migraine without aura, not intractable, without status migrainosus: Secondary | ICD-10-CM | POA: Diagnosis not present

## 2021-10-13 DIAGNOSIS — E785 Hyperlipidemia, unspecified: Secondary | ICD-10-CM | POA: Diagnosis not present

## 2021-10-13 DIAGNOSIS — G4452 New daily persistent headache (NDPH): Secondary | ICD-10-CM | POA: Diagnosis not present

## 2021-10-20 DIAGNOSIS — E7800 Pure hypercholesterolemia, unspecified: Secondary | ICD-10-CM | POA: Diagnosis not present

## 2021-10-20 DIAGNOSIS — E782 Mixed hyperlipidemia: Secondary | ICD-10-CM | POA: Diagnosis not present

## 2021-10-20 DIAGNOSIS — E039 Hypothyroidism, unspecified: Secondary | ICD-10-CM | POA: Diagnosis not present

## 2021-10-20 DIAGNOSIS — Z79899 Other long term (current) drug therapy: Secondary | ICD-10-CM | POA: Diagnosis not present

## 2021-11-08 DIAGNOSIS — C539 Malignant neoplasm of cervix uteri, unspecified: Secondary | ICD-10-CM | POA: Diagnosis not present

## 2021-11-08 DIAGNOSIS — E559 Vitamin D deficiency, unspecified: Secondary | ICD-10-CM | POA: Diagnosis not present

## 2021-11-08 DIAGNOSIS — N390 Urinary tract infection, site not specified: Secondary | ICD-10-CM | POA: Diagnosis not present

## 2021-11-08 DIAGNOSIS — Z124 Encounter for screening for malignant neoplasm of cervix: Secondary | ICD-10-CM | POA: Diagnosis not present

## 2021-11-08 DIAGNOSIS — R519 Headache, unspecified: Secondary | ICD-10-CM | POA: Diagnosis not present

## 2022-01-01 ENCOUNTER — Other Ambulatory Visit (HOSPITAL_COMMUNITY): Payer: Self-pay | Admitting: Family Medicine

## 2022-01-01 ENCOUNTER — Other Ambulatory Visit: Payer: Self-pay | Admitting: Family Medicine

## 2022-01-01 ENCOUNTER — Ambulatory Visit (HOSPITAL_COMMUNITY)
Admission: RE | Admit: 2022-01-01 | Discharge: 2022-01-01 | Disposition: A | Discharge status: Home / Self Care | Payer: BC Managed Care – PPO | Source: Ambulatory Visit | Attending: Family Medicine | Admitting: Family Medicine

## 2022-01-01 ENCOUNTER — Other Ambulatory Visit (HOSPITAL_COMMUNITY)
Admission: RE | Admit: 2022-01-01 | Discharge: 2022-01-01 | Disposition: A | Discharge status: Home / Self Care | Payer: BC Managed Care – PPO | Source: Ambulatory Visit | Attending: Family Medicine | Admitting: Family Medicine

## 2022-01-01 DIAGNOSIS — Z79899 Other long term (current) drug therapy: Secondary | ICD-10-CM | POA: Diagnosis not present

## 2022-01-01 DIAGNOSIS — E782 Mixed hyperlipidemia: Secondary | ICD-10-CM | POA: Diagnosis not present

## 2022-01-01 DIAGNOSIS — R1032 Left lower quadrant pain: Secondary | ICD-10-CM | POA: Diagnosis not present

## 2022-01-01 DIAGNOSIS — R5383 Other fatigue: Secondary | ICD-10-CM | POA: Diagnosis not present

## 2022-01-01 DIAGNOSIS — N390 Urinary tract infection, site not specified: Secondary | ICD-10-CM | POA: Diagnosis not present

## 2022-01-01 DIAGNOSIS — Z882 Allergy status to sulfonamides status: Secondary | ICD-10-CM | POA: Diagnosis not present

## 2022-01-01 DIAGNOSIS — R109 Unspecified abdominal pain: Secondary | ICD-10-CM | POA: Diagnosis not present

## 2022-01-01 LAB — BASIC METABOLIC PANEL
Anion gap: 5 (ref 5–15)
BUN: 8 mg/dL (ref 6–20)
CO2: 27 mmol/L (ref 22–32)
Calcium: 9.3 mg/dL (ref 8.9–10.3)
Chloride: 105 mmol/L (ref 98–111)
Creatinine, Ser: 0.42 mg/dL — ABNORMAL LOW (ref 0.44–1.00)
GFR, Estimated: >60 mL/min (ref >60)
Glucose, Bld: 100 mg/dL — ABNORMAL HIGH (ref 70–99)
Potassium: 3.9 mmol/L (ref 3.5–5.1)
Sodium: 137 mmol/L (ref 135–145)

## 2022-01-01 LAB — CBC
HCT: 39.6 % (ref 36.0–46.0)
Hemoglobin: 12.9 g/dL (ref 12.0–15.0)
MCH: 28.8 pg (ref 26.0–34.0)
MCHC: 32.6 g/dL (ref 30.0–36.0)
MCV: 88.4 fL (ref 80.0–100.0)
Platelets: 249 10*3/uL (ref 150–400)
RBC: 4.48 MIL/uL (ref 3.87–5.11)
RDW: 13.4 % (ref 11.5–15.5)
WBC: 8.4 10*3/uL (ref 4.0–10.5)
nRBC: 0 % (ref 0.0–0.2)

## 2022-01-01 MED ORDER — IOHEXOL 300 MG/ML  SOLN
100.0000 mL | Freq: Once | INTRAMUSCULAR | Status: AC | PRN
  Administered 2022-01-01: 100 mL via INTRAVENOUS

## 2022-01-03 ENCOUNTER — Encounter (INDEPENDENT_AMBULATORY_CARE_PROVIDER_SITE_OTHER): Payer: Self-pay | Admitting: *Deleted

## 2022-01-16 ENCOUNTER — Other Ambulatory Visit: Payer: Self-pay | Admitting: *Deleted

## 2022-01-16 DIAGNOSIS — K6389 Other specified diseases of intestine: Secondary | ICD-10-CM

## 2022-01-18 ENCOUNTER — Encounter: Payer: Self-pay | Admitting: General Surgery

## 2022-01-18 ENCOUNTER — Ambulatory Visit: Payer: BC Managed Care – PPO | Admitting: General Surgery

## 2022-01-18 VITALS — BP 112/78 | HR 81 | Temp 98.2°F | Resp 16 | Ht 62.0 in | Wt 179.0 lb

## 2022-01-18 DIAGNOSIS — K55069 Acute infarction of intestine, part and extent unspecified: Secondary | ICD-10-CM

## 2022-01-18 DIAGNOSIS — K6389 Other specified diseases of intestine: Secondary | ICD-10-CM

## 2022-01-19 NOTE — Progress Notes (Signed)
Summer Hughes; [id number]; [birth year]   HPI Patient is a 41 year old Hispanic female who was referred to my care by Dr. Gareth Morgan for evaluation and treatment of left-sided abdominal pain.  A CT scan of the abdomen revealed epiploic appendagitis.  Patient did take ibuprofen for approximately 24 hours.  Over the past few weeks, she has had decreasing abdominal pain.  Minimal nausea has been present.  She denies any emesis.  No fever chills have been noted.  Patient's daughter assisted with translation. Past Medical History:  Diagnosis Date   Abdominal pain, left upper quadrant 10/21/2017    Past Surgical History:  Procedure Laterality Date   CHOLECYSTECTOMY     TUBAL LIGATION      Family History  Problem Relation Age of Onset   Healthy Father    Healthy Mother     Current Outpatient Medications on File Prior to Visit  Medication Sig Dispense Refill   omeprazole (PRILOSEC) 40 MG capsule Take 40 mg by mouth daily. (Patient not taking: Reported on 01/18/2022)     No current facility-administered medications on file prior to visit.    No Known Allergies  Social History   Substance and Sexual Activity  Alcohol Use No    Social History   Tobacco Use  Smoking Status Never  Smokeless Tobacco Never    Review of Systems  Constitutional: Negative.   HENT: Negative.    Eyes: Negative.   Respiratory: Negative.    Cardiovascular: Negative.   Gastrointestinal:  Positive for abdominal pain and heartburn.  Genitourinary:  Positive for frequency.  Musculoskeletal: Negative.   Skin: Negative.   Neurological:  Positive for headaches.  Endo/Heme/Allergies: Negative.   Psychiatric/Behavioral: Negative.     Objective   Vitals:   01/18/22 1007  BP: 112/78  Pulse: 81  Resp: 16  Temp: 98.2 F (36.8 C)  SpO2: 96%    Physical Exam Vitals reviewed.  Constitutional:      Appearance: Normal appearance. She is not ill-appearing.  HENT:     Head: Normocephalic and  atraumatic.  Cardiovascular:     Rate and Rhythm: Normal rate and regular rhythm.     Heart sounds: Normal heart sounds. No murmur heard.   No friction rub. No gallop.  Pulmonary:     Effort: Pulmonary effort is normal. No respiratory distress.     Breath sounds: Normal breath sounds. No stridor. No wheezing, rhonchi or rales.  Abdominal:     General: Bowel sounds are normal. There is no distension.     Palpations: Abdomen is soft. There is no mass.     Tenderness: There is no abdominal tenderness. There is no guarding or rebound.     Hernia: No hernia is present.  Skin:    General: Skin is warm and dry.  Neurological:     Mental Status: She is alert and oriented to person, place, and time.   CT scan images personally reviewed Assessment  Epiploic appendagitis, resolving Plan  I told the patient that there was no need for surgical intervention at this time.  I did give her literature concerning this condition.  She was told to try further Motrin as needed.  She understands and agrees.  Follow-up here as needed.

## 2022-01-24 ENCOUNTER — Encounter (INDEPENDENT_AMBULATORY_CARE_PROVIDER_SITE_OTHER): Payer: Self-pay | Admitting: Gastroenterology

## 2022-02-19 ENCOUNTER — Ambulatory Visit (INDEPENDENT_AMBULATORY_CARE_PROVIDER_SITE_OTHER): Payer: BC Managed Care – PPO | Admitting: Gastroenterology

## 2022-03-05 ENCOUNTER — Encounter (INDEPENDENT_AMBULATORY_CARE_PROVIDER_SITE_OTHER): Payer: Self-pay | Admitting: Gastroenterology

## 2022-03-05 ENCOUNTER — Ambulatory Visit (INDEPENDENT_AMBULATORY_CARE_PROVIDER_SITE_OTHER): Payer: BC Managed Care – PPO | Admitting: Gastroenterology

## 2022-03-05 VITALS — BP 113/78 | HR 89 | Temp 98.0°F | Ht 62.0 in | Wt 181.7 lb

## 2022-03-05 DIAGNOSIS — K6389 Other specified diseases of intestine: Secondary | ICD-10-CM | POA: Insufficient documentation

## 2022-03-05 DIAGNOSIS — R1012 Left upper quadrant pain: Secondary | ICD-10-CM

## 2022-03-05 MED ORDER — OMEPRAZOLE 40 MG PO CPDR: 40.0000 mg | DELAYED_RELEASE_CAPSULE | Freq: Every day | ORAL | 1 refills | Status: DC

## 2022-03-05 MED ORDER — DICYCLOMINE HCL 10 MG PO CAPS: 10.0000 mg | ORAL_CAPSULE | Freq: Two times a day (BID) | ORAL | 1 refills | Status: DC | PRN

## 2022-03-05 NOTE — Progress Notes (Signed)
Summer Hughes, M.D. Gastroenterology & Hepatology [facility] [address] [place], [place] [address] Primary Care Physician: Dillard Essex, NP [address] [place] [address]  Referring MD: PCP  Chief Complaint: Abdominal pain  History of Present Illness: Summer Hughes is a 41 y.o. female with no PMH, who presents for evaluation of abdominal pain.  Patient reports that in May 2023 she presented new onset of L sided abdominal pain, mostly on the L flank and did not radiate anywhere else. She had severe pain and decided to come to the ER. The patient was evaluated in the ER at [facility] on 01/01/2022, BMP and CBC were within normal limits.  A CT of the abdomen and pelvis with IV contrast showed focal fatty infiltration in the left lower abdominal quadrant which could be related to epiploic appendagitis or omental infarct.  The patient was discharged on hydrocodone and Zofran.  She was advised to follow with her PCP.  PCP recommended her to follow-up with GI and general surgery. She reports that she was discharged home and reports that her abdominal pain persisted with significant severity for close to 3 weeks. However, she states her pain has remained since then, up to 5/10 in intensity. She describes the pain as a frequent pressure. The patient denies having any nausea, vomiting, fever, chills, hematochezia, melena, hematemesis, abdominal distention, diarrhea, jaundice, pruritus or weight loss. She has 2 BM every day.  She saw Dr. Lovell Sheehan on 01/18/2022 who recommended conservative management and Motrin as needed for epiploic appendagitis.  She reports that she had an upper abdominal pain in 2019 for which she was seen in the office but she reported this pain was mostly in the upper abdomen and occasionally in the left upper quadrant.   She also endorses a chronic history of thin bowel movements and tenesmus - she  states she still has these episodes.  She has not taken a PPI for her pain.  Last EGD:neg Last Colonoscopy:neg  FHx: neg for any gastrointestinal/liver disease, no malignancies Social: neg smoking, alcohol or illicit drug use Surgical:  cholecystectomy, tubal ligation  Past Medical History: Past Medical History:  Diagnosis Date   Abdominal pain, left upper quadrant 10/21/2017    Past Surgical History: Past Surgical History:  Procedure Laterality Date   CHOLECYSTECTOMY     TUBAL LIGATION      Family History: Family History  Problem Relation Age of Onset   Healthy Father    Healthy Mother     Social History: Social History   Tobacco Use  Smoking Status Never   Passive exposure: Never  Smokeless Tobacco Never   Social History   Substance and Sexual Activity  Alcohol Use No   Social History   Substance and Sexual Activity  Drug Use No    Allergies: No Known Allergies  Medications: No current outpatient medications on file.   No current facility-administered medications for this visit.    Review of Systems: GENERAL: negative for malaise, night sweats HEENT: No changes in hearing or vision, no nose bleeds or other nasal problems. NECK: Negative for lumps, goiter, pain and significant neck swelling RESPIRATORY: Negative for cough, wheezing CARDIOVASCULAR: Negative for chest pain, leg swelling, palpitations, orthopnea GI: SEE HPI MUSCULOSKELETAL: Negative for joint pain or swelling, back pain, and muscle pain. SKIN: Negative for lesions, rash PSYCH: Negative for sleep disturbance, mood disorder and recent psychosocial stressors. HEMATOLOGY Negative for prolonged bleeding, bruising easily, and swollen  nodes. ENDOCRINE: Negative for cold or heat intolerance, polyuria, polydipsia and goiter. NEURO: negative for tremor, gait imbalance, syncope and seizures. The remainder of the review of systems is noncontributory.   Physical Exam: BP 113/78 (BP Location:  Right Arm, Patient Position: Sitting, Cuff Size: Large)   Pulse 89   Temp 98 F (36.7 C) (Oral)   Ht 5\' 2"  (1.575 m)   Wt 181 lb 11.2 oz (82.4 kg)   BMI 33.23 kg/m  GENERAL: The patient is AO x3, in no acute distress. Obese. HEENT: Head is normocephalic and atraumatic. EOMI are intact. Mouth is well hydrated and without lesions. NECK: Supple. No masses LUNGS: Clear to auscultation. No presence of rhonchi/wheezing/rales. Adequate chest expansion HEART: RRR, normal s1 and s2. ABDOMEN: tender to palpation in L flank and LLQ, no guarding, no peritoneal signs, and nondistended. BS +. No masses. EXTREMITIES: Without any cyanosis, clubbing, rash, lesions or edema. NEUROLOGIC: AOx3, no focal motor deficit. SKIN: no jaundice, no rashes   Imaging/Labs: as above  I personally reviewed and interpreted the available labs, imaging and endoscopic files.  Impression and Plan: Summer Hughes is a 41 y.o. female with no PMH, who presents for evaluation of abdominal pain.  The patient had acute onset of abdominal pain for which she underwent evaluation in the ER.  Had findings suggestive of epiploic appendagitis that have improved with conservative management.  At this moment, we will start her on omeprazole 40 mg every day and she can take some Bentyl as needed to improve her abdominal pain control.  We discussed that if her symptoms persist in the next weeks, she will need to have further evaluation with an EGD and colonoscopy which she is agreeable to proceed with.  -Start omeprazole 40 mg every day - Start Bentyl 1 tablet q12h as needed for abdominal pain -Return to clinic in 6 weeks  All questions were answered.      44, MD Gastroenterology and Hepatology [facility]

## 2022-03-05 NOTE — Patient Instructions (Signed)
Empiece a tomar omeprazole 40 mg diario Tome Bentyl 1 tableta cada 12 horas a necesidad si presenta dolor abdominal

## 2022-03-15 ENCOUNTER — Ambulatory Visit (INDEPENDENT_AMBULATORY_CARE_PROVIDER_SITE_OTHER): Payer: BC Managed Care – PPO

## 2022-03-15 ENCOUNTER — Ambulatory Visit
Admission: EM | Admit: 2022-03-15 | Discharge: 2022-03-15 | Disposition: A | Discharge status: Home / Self Care | Payer: BC Managed Care – PPO

## 2022-03-15 ENCOUNTER — Encounter: Payer: Self-pay | Admitting: Emergency Medicine

## 2022-03-15 DIAGNOSIS — M25571 Pain in right ankle and joints of right foot: Secondary | ICD-10-CM

## 2022-03-15 DIAGNOSIS — M7731 Calcaneal spur, right foot: Secondary | ICD-10-CM | POA: Diagnosis not present

## 2022-03-15 DIAGNOSIS — S8011XA Contusion of right lower leg, initial encounter: Secondary | ICD-10-CM

## 2022-03-15 HISTORY — DX: Gastro-esophageal reflux disease without esophagitis: K21.9

## 2022-03-15 NOTE — ED Provider Notes (Signed)
[facility]    CSN: [id number] Arrival date & time: 03/15/22  1638      History   Chief Complaint No chief complaint on file.   HPI Summer Hughes is a 41 y.o. female.   The history is provided by the patient and a relative. A language interpreter was used (Patient refused interpretation services, requested her daughter be used as an Equities trader.).   Patient presents for complaints of right lower leg pain has been present for the past 9 days.  Patient's daughter states that patient fell down the steps.  Patient has pain and swelling to the medial aspect of the right lower leg immediately above the ankle.  Prolonged walking makes the patient's symptoms worse.  Denies numbness, tingling, radiation of pain, or inability to bear weight.  She has used ice and taken ibuprofen for her symptoms denies any previous injury or trauma to the area.   Past Medical History:  Diagnosis Date   Abdominal pain, left upper quadrant 10/21/2017   Acid reflux     Patient Active Problem List   Diagnosis Date Noted   Epiploic appendagitis 03/05/2022   Abdominal pain, left upper quadrant 10/21/2017   Precordial pain 03/31/2014    Past Surgical History:  Procedure Laterality Date   CHOLECYSTECTOMY     TUBAL LIGATION      OB History     Gravida      Para      Term      Preterm      AB      Living  4      SAB      IAB      Ectopic      Multiple      Live Births               Home Medications    Prior to Admission medications   Medication Sig Start Date End Date Taking? Authorizing Provider  ibuprofen (ADVIL) 800 MG tablet Take 800 mg by mouth every 8 (eight) hours as needed.   Yes [provider]  dicyclomine (BENTYL) 10 MG capsule Take 1 capsule (10 mg total) by mouth every 12 (twelve) hours as needed for spasms. Tome Bentyl 1 tableta cada 12 horas a necesidad si presenta dolor abdominal 03/05/22   Marguerita Merles, Reuel Boom, MD   omeprazole (PRILOSEC) 40 MG capsule Take 1 capsule (40 mg total) by mouth daily. tomar omeprazole 40 mg diario 03/05/22   Dolores Frame, MD    Family History Family History  Problem Relation Age of Onset   Healthy Father    Healthy Mother     Social History Social History   Tobacco Use   Smoking status: Never    Passive exposure: Never   Smokeless tobacco: Never  Substance Use Topics   Alcohol use: No   Drug use: No     Allergies   Patient has no known allergies.   Review of Systems Review of Systems Per HPI  Physical Exam Triage Vital Signs ED Triage Vitals  Enc Vitals Group     BP 03/15/22 1644 132/85     Pulse Rate 03/15/22 1644 88     Resp 03/15/22 1644 18     Temp 03/15/22 1644 98.4 F (36.9 C)     Temp Source 03/15/22 1644 Oral     SpO2 03/15/22 1644 97 %     Weight --      Height --  Head Circumference --      Peak Flow --      Pain Score 03/15/22 1645 10     Pain Loc --      Pain Edu? --      Excl. in GC? --    No data found.  Updated Vital Signs BP 132/85 (BP Location: Right Arm)   Pulse 88   Temp 98.4 F (36.9 C) (Oral)   Resp 18   LMP 03/06/2022 (Approximate)   SpO2 97%   Visual Acuity Right Eye Distance:   Left Eye Distance:   Bilateral Distance:    Right Eye Near:   Left Eye Near:    Bilateral Near:     Physical Exam Vitals and nursing note reviewed.  Constitutional:      Appearance: Normal appearance.  HENT:     Head: Normocephalic.  Eyes:     Extraocular Movements: Extraocular movements intact.     Pupils: Pupils are equal, round, and reactive to light.  Pulmonary:     Effort: Pulmonary effort is normal.  Musculoskeletal:     Cervical back: Normal range of motion.     Right lower leg: Swelling and tenderness present. No deformity.     Right ankle: Normal. No swelling. No tenderness. Normal range of motion. Normal pulse.       Legs:     Comments: Tenderness noted to the medial aspect of the right  distal tib-fib region.  Bruising is noted to the affected area.  Patient has full range of motion to the right ankle.  There is no tenderness to the right ankle or foot.  Right foot and ankle without obvious deformity or ecchymosis.  Skin:    General: Skin is warm and dry.     Capillary Refill: Capillary refill takes less than 2 seconds.  Neurological:     General: No focal deficit present.     Mental Status: She is alert and oriented to person, place, and time.  Psychiatric:        Mood and Affect: Mood normal.        Behavior: Behavior normal.      UC Treatments / Results  Labs (all labs ordered are listed, but only abnormal results are displayed) Labs Reviewed - No data to display  EKG   Radiology DG Ankle Complete Right  Result Date: 03/15/2022 CLINICAL DATA:  Medial ankle pain after twisting injury 9 days ago. EXAM: RIGHT ANKLE - COMPLETE 3+ VIEW COMPARISON:  None Available. FINDINGS: The mineralization and alignment are normal. There is no evidence of acute fracture or dislocation. Probable mild tibiotalar degenerative changes on the lateral view. Small plantar calcaneal spur. No foreign body or focal soft tissue abnormality identified. IMPRESSION: No evidence of acute fracture or dislocation. Electronically Signed   By: Carey Bullocks M.D.   On: 03/15/2022 17:01    Procedures Procedures (including critical care time)  Medications Ordered in UC Medications - No data to display  Initial Impression / Assessment and Plan / UC Course  I have reviewed the triage vital signs and the nursing notes.  Pertinent labs & imaging results that were available during my care of the patient were reviewed by me and considered in my medical decision making (see chart for details).  Patient presents with right lower extremity pain after she fell down some steps approximately 9 days ago.  On exam, patient has tenderness with bruising and swelling to the medial aspect of the distal tibia.  She  has full range of motion of the ankle with no difficulty.  X-rays are negative for fracture or dislocation.  Supportive care recommendations were provided to the patient and her daughter.  Patient advised to continue ibuprofen or Tylenol for analgesia.  Also advised patient to continue use of RICE therapy.  Patient was advised that if symptoms do not improve within the next 2 to 4 weeks, recommend following up with [facility] [place] or with [facility] in [place].  Information was provided for both.  Patient advised to follow-up as needed. Final Clinical Impressions(s) / UC Diagnoses   Final diagnoses:  Contusion of right lower leg, initial encounter     Discharge Instructions      Your x-rays are negative for fracture or dislocation.  Symptoms are consistent with a contusion to the right lower leg. May continue to take over-the-counter ibuprofen or Tylenol for pain or discomfort. RICE therapy rest, ice, compression, and elevation until your symptoms improve.  Apply ice for 20 minutes, remove for 1 hour, then repeat as often as possible.  This will help with pain and swelling. If symptoms do not improve within the next 2 to 4 weeks, recommend following up with orthopedics.  You can follow-up with [facility]  at [phone] or [facility] in [place] at [phone].      ED Prescriptions   None    PDMP not reviewed this encounter.   Abran Cantor, NP 03/15/22 1727

## 2022-03-15 NOTE — ED Triage Notes (Signed)
Fell last Wednesday.  Pain to right ankle.  Pain radiates when walking.

## 2022-03-15 NOTE — Discharge Instructions (Addendum)
Your x-rays are negative for fracture or dislocation.  Symptoms are consistent with a contusion to the right lower leg. May continue to take over-the-counter ibuprofen or Tylenol for pain or discomfort. RICE therapy rest, ice, compression, and elevation until your symptoms improve.  Apply ice for 20 minutes, remove for 1 hour, then repeat as often as possible.  This will help with pain and swelling. If symptoms do not improve within the next 2 to 4 weeks, recommend following up with orthopedics.  You can follow-up with [facility]  at [phone] or [facility] in [place] at [phone].

## 2022-04-09 ENCOUNTER — Ambulatory Visit (INDEPENDENT_AMBULATORY_CARE_PROVIDER_SITE_OTHER): Payer: BC Managed Care – PPO | Admitting: Gastroenterology

## 2022-04-09 ENCOUNTER — Encounter (INDEPENDENT_AMBULATORY_CARE_PROVIDER_SITE_OTHER): Payer: Self-pay | Admitting: Gastroenterology

## 2022-04-09 VITALS — BP 112/77 | HR 85 | Temp 98.1°F | Ht 62.0 in | Wt 181.8 lb

## 2022-04-09 DIAGNOSIS — R109 Unspecified abdominal pain: Secondary | ICD-10-CM | POA: Insufficient documentation

## 2022-04-09 DIAGNOSIS — K6389 Other specified diseases of intestine: Secondary | ICD-10-CM | POA: Diagnosis not present

## 2022-04-09 DIAGNOSIS — K219 Gastro-esophageal reflux disease without esophagitis: Secondary | ICD-10-CM | POA: Diagnosis not present

## 2022-04-09 NOTE — Progress Notes (Unsigned)
Summer Hughes, M.D. Gastroenterology & Hepatology [facility] [address] [place], [place] [address]  Primary Care Physician: Dillard Essex, NP [address] [place] [address]  I will communicate my assessment and recommendations to the referring MD via EMR.  Problems: Abdominal pain Epiploic appendagitis  History of Present Illness: Summer Hughes is a 41 y.o. female with no PMH, who presents for follow up of abdominal pain and epiploic appendagitis.  The patient was last seen on 03/05/2022. At that time, the patient was started on omeprazole 40 mg every day and Bentyl as needed for pain episodes.  Patient reports that there symptoms that she presented recurrent episodes of pain in the left side of her abdomen only 3 days ago. She states she has noticed that when she has pain in the left side and bloating, she hs urgency to urinate. She may also have some occasional fecal tenesmus when she has a BM and has to have 3-4 BM per day, but usually has 1-2 Bms per day.  Denies having any nausea, vomiting, fever, chills, hematochezia, melena, hematemesis, abdominal distention, jaundice, pruritus or weight loss.  She is currently taking omeprazole 40 mg every other day now and denies any heartburn or dysphagia. Has not been taking dicyclomine until recetnly when he r pain came back.   Last EGD:neg Last Colonoscopy:neg  Past Medical History: Past Medical History:  Diagnosis Date   Abdominal pain, left upper quadrant 10/21/2017   Acid reflux     Past Surgical History: Past Surgical History:  Procedure Laterality Date   CHOLECYSTECTOMY     TUBAL LIGATION      Family History: Family History  Problem Relation Age of Onset   Healthy Father    Healthy Mother     Social History: Social History   Tobacco Use  Smoking Status Never   Passive exposure: Never  Smokeless Tobacco Never   Social History    Substance and Sexual Activity  Alcohol Use No   Social History   Substance and Sexual Activity  Drug Use No    Allergies: No Known Allergies  Medications: Current Outpatient Medications  Medication Sig Dispense Refill   dicyclomine (BENTYL) 10 MG capsule Take 1 capsule (10 mg total) by mouth every 12 (twelve) hours as needed for spasms. Tome Bentyl 1 tableta cada 12 horas a necesidad si presenta dolor abdominal 60 capsule 1   ibuprofen (ADVIL) 800 MG tablet Take 800 mg by mouth every 8 (eight) hours as needed.     omeprazole (PRILOSEC) 40 MG capsule Take 1 capsule (40 mg total) by mouth daily. tomar omeprazole 40 mg diario 90 capsule 1   No current facility-administered medications for this visit.    Review of Systems: GENERAL: negative for malaise, night sweats HEENT: No changes in hearing or vision, no nose bleeds or other nasal problems. NECK: Negative for lumps, goiter, pain and significant neck swelling RESPIRATORY: Negative for cough, wheezing CARDIOVASCULAR: Negative for chest pain, leg swelling, palpitations, orthopnea GI: SEE HPI MUSCULOSKELETAL: Negative for joint pain or swelling, back pain, and muscle pain. SKIN: Negative for lesions, rash PSYCH: Negative for sleep disturbance, mood disorder and recent psychosocial stressors. HEMATOLOGY Negative for prolonged bleeding, bruising easily, and swollen nodes. ENDOCRINE: Negative for cold or heat intolerance, polyuria, polydipsia and goiter. NEURO: negative for tremor, gait imbalance, syncope and seizures. The remainder of the review of systems is noncontributory.   Physical Exam: BP 112/77 (BP Location: Left Arm, Patient  Position: Sitting, Cuff Size: Large)   Pulse 85   Temp 98.1 F (36.7 C) (Oral)   Ht 5\' 2"  (1.575 m)   Wt 181 lb 12.8 oz (82.5 kg)   LMP 03/06/2022 (Approximate)   BMI 33.25 kg/m  GENERAL: The patient is AO x3, in no acute distress. HEENT: Head is normocephalic and atraumatic. EOMI are  intact. Mouth is well hydrated and without lesions. NECK: Supple. No masses LUNGS: Clear to auscultation. No presence of rhonchi/wheezing/rales. Adequate chest expansion HEART: RRR, normal s1 and s2. ABDOMEN: very tender to palpation in the L side of the abdomen, no guarding, no peritoneal signs, and nondistended. BS +. No masses. EXTREMITIES: Without any cyanosis, clubbing, rash, lesions or edema. NEUROLOGIC: AOx3, no focal motor deficit. SKIN: no jaundice, no rashes  Imaging/Labs: as above  I personally reviewed and interpreted the available labs, imaging and endoscopic files.  Impression and Plan: Summer Hughes is a 41 y.o. female with no PMH, who presents for follow up of abdominal pain and epiploic appendagitis. Patient has presented some improvement of her abdominal pain while taking antispasmodics. This may not help with the treatment of epiploic appendagitis but has provided some symptom relief. However, she is still presenting some significant pain with palpation - would recommend her to follow up with general surgery.  She has had some improvement of her GERD symptoms while taking omeprazole every other day, but may need to take it on a daily basis if her symptoms recur.  - Follow up with general surgery - Continue Bentyl as needed for abdominal pain - Can continue omeprazole 40 mg every other day, may take it daily if symptoms recur - May consider EGD and colonoscopy if symptoms persist and no surgical management warranted.  All questions were answered.      44, MD Gastroenterology and Hepatology [facility]

## 2022-04-09 NOTE — Patient Instructions (Addendum)
Make follow up appointment with general surgery Continue Bentyl as needed for abdominal pain Can continue omeprazole 40 mg every other day, may take it daily if symptoms recur

## 2022-08-03 DIAGNOSIS — R1032 Left lower quadrant pain: Secondary | ICD-10-CM | POA: Diagnosis not present

## 2022-08-03 DIAGNOSIS — E559 Vitamin D deficiency, unspecified: Secondary | ICD-10-CM | POA: Diagnosis not present

## 2022-08-03 DIAGNOSIS — K760 Fatty (change of) liver, not elsewhere classified: Secondary | ICD-10-CM | POA: Diagnosis not present

## 2022-08-10 DIAGNOSIS — Z79899 Other long term (current) drug therapy: Secondary | ICD-10-CM | POA: Diagnosis not present

## 2022-08-10 DIAGNOSIS — E782 Mixed hyperlipidemia: Secondary | ICD-10-CM | POA: Diagnosis not present

## 2022-08-10 DIAGNOSIS — E039 Hypothyroidism, unspecified: Secondary | ICD-10-CM | POA: Diagnosis not present

## 2022-08-10 DIAGNOSIS — R799 Abnormal finding of blood chemistry, unspecified: Secondary | ICD-10-CM | POA: Diagnosis not present

## 2022-08-10 DIAGNOSIS — E7800 Pure hypercholesterolemia, unspecified: Secondary | ICD-10-CM | POA: Diagnosis not present

## 2022-09-04 DIAGNOSIS — J209 Acute bronchitis, unspecified: Secondary | ICD-10-CM | POA: Diagnosis not present

## 2022-09-05 ENCOUNTER — Other Ambulatory Visit (INDEPENDENT_AMBULATORY_CARE_PROVIDER_SITE_OTHER): Payer: Self-pay | Admitting: Gastroenterology

## 2022-09-05 DIAGNOSIS — R1012 Left upper quadrant pain: Secondary | ICD-10-CM

## 2022-09-17 ENCOUNTER — Ambulatory Visit (INDEPENDENT_AMBULATORY_CARE_PROVIDER_SITE_OTHER): Payer: BC Managed Care – PPO | Admitting: Gastroenterology

## 2022-09-17 ENCOUNTER — Encounter (INDEPENDENT_AMBULATORY_CARE_PROVIDER_SITE_OTHER): Payer: Self-pay | Admitting: *Deleted

## 2022-09-17 ENCOUNTER — Encounter (INDEPENDENT_AMBULATORY_CARE_PROVIDER_SITE_OTHER): Payer: Self-pay | Admitting: Gastroenterology

## 2022-09-17 VITALS — BP 123/85 | HR 101 | Temp 97.8°F | Ht 62.0 in | Wt 185.7 lb

## 2022-09-17 DIAGNOSIS — K625 Hemorrhage of anus and rectum: Secondary | ICD-10-CM | POA: Diagnosis not present

## 2022-09-17 DIAGNOSIS — R109 Unspecified abdominal pain: Secondary | ICD-10-CM | POA: Diagnosis not present

## 2022-09-17 DIAGNOSIS — R1012 Left upper quadrant pain: Secondary | ICD-10-CM

## 2022-09-17 DIAGNOSIS — K219 Gastro-esophageal reflux disease without esophagitis: Secondary | ICD-10-CM

## 2022-09-17 MED ORDER — HYOSCYAMINE SULFATE 0.125 MG SL SUBL: 0.1250 mg | SUBLINGUAL_TABLET | Freq: Four times a day (QID) | SUBLINGUAL | 1 refills | Status: DC | PRN

## 2022-09-17 MED ORDER — OMEPRAZOLE 40 MG PO CPDR: 40.0000 mg | DELAYED_RELEASE_CAPSULE | Freq: Every day | ORAL | 3 refills | Status: DC

## 2022-09-17 NOTE — Progress Notes (Signed)
Maylon Peppers, M.D. Gastroenterology & Hepatology [facility] [address] [place], [place] [address]  Primary Care Physician: Carlis Abbott, NP [place] [address]  I will communicate my assessment and recommendations to the referring MD via EMR.  Problems: L sided abdominal pain GERD Rectal bleeding  History of Present Illness: Summer Hughes is a 42 y.o. female with past medical history of GERD, who presents for follow up of abdominal pain.  The patient was last seen on 04/09/2022. At that time, the patient was advised to follow with general surgery and to take Bentyl as needed, which was mildly helping her pain.  She did not follow with the general surgeon as she was not sure if she wants a surgical evaluation at that time. She also reports that she also restarted omeprazole 40 mg qday which controls heartburn, denies dysphagia or odynophagia.  Since her last visit, she reports that she has constant pain in her L flank, now radiating to her back. States that when she eats spicy food, it may get worse.She reports that she was advised with her PCP to have a repeat CT scan but she wanted to discuss it first further with me .  She also feels "her bowels are working too slow", as if she eats late she will have a BM next day. She feels discomfort in her abdomen if this happens.  States that last Thursday she noticed recurrent episodes of rectal bleeding. She describes it as fresh blood in stool. She states that the blood has ben less frequent for the last few days but still present.  The patient denies having any nausea, vomiting, fever, chills, hematochezia, melena, hematemesis, abdominal distention, abdominal pain, diarrhea, jaundice, pruritus or weight loss.  Last EGD: Never Last Colonoscopy: Never  Past Medical History: Past Medical History:  Diagnosis Date   Abdominal pain, left upper quadrant  10/21/2017   Acid reflux     Past Surgical History: Past Surgical History:  Procedure Laterality Date   CHOLECYSTECTOMY     TUBAL LIGATION      Family History: Family History  Problem Relation Age of Onset   Healthy Father    Healthy Mother     Social History: Social History   Tobacco Use  Smoking Status Never   Passive exposure: Never  Smokeless Tobacco Never   Social History   Substance and Sexual Activity  Alcohol Use No   Social History   Substance and Sexual Activity  Drug Use No    Allergies: No Known Allergies  Medications: Current Outpatient Medications  Medication Sig Dispense Refill   ibuprofen (ADVIL) 800 MG tablet Take 800 mg by mouth every 8 (eight) hours as needed.     omeprazole (PRILOSEC) 40 MG capsule TAKE 1 CAPSULE BY MOUTH DAILY 90 capsule 1   dicyclomine (BENTYL) 10 MG capsule Take 1 capsule (10 mg total) by mouth every 12 (twelve) hours as needed for spasms. Tome Bentyl 1 tableta cada 12 horas a necesidad si presenta dolor abdominal (Patient not taking: Reported on 09/17/2022) 60 capsule 1   No current facility-administered medications for this visit.    Review of Systems: GENERAL: negative for malaise, night sweats HEENT: No changes in hearing or vision, no nose bleeds or other nasal problems. NECK: Negative for lumps, goiter, pain and significant neck swelling RESPIRATORY: Negative for cough, wheezing CARDIOVASCULAR: Negative for chest pain, leg swelling, palpitations, orthopnea GI: SEE HPI MUSCULOSKELETAL: Negative for joint pain or  swelling, back pain, and muscle pain. SKIN: Negative for lesions, rash PSYCH: Negative for sleep disturbance, mood disorder and recent psychosocial stressors. HEMATOLOGY Negative for prolonged bleeding, bruising easily, and swollen nodes. ENDOCRINE: Negative for cold or heat intolerance, polyuria, polydipsia and goiter. NEURO: negative for tremor, gait imbalance, syncope and seizures. The remainder of  the review of systems is noncontributory.   Physical Exam: BP 123/85 (BP Location: Left Arm, Patient Position: Sitting, Cuff Size: Large)   Pulse (!) 101   Temp 97.8 F (36.6 C) (Temporal)   Ht 5' 2"$  (1.575 m)   Wt 185 lb 11.2 oz (84.2 kg)   BMI 33.96 kg/m  GENERAL: The patient is AO x3, in no acute distress. HEENT: Head is normocephalic and atraumatic. EOMI are intact. Mouth is well hydrated and without lesions. NECK: Supple. No masses LUNGS: Clear to auscultation. No presence of rhonchi/wheezing/rales. Adequate chest expansion HEART: RRR, normal s1 and s2. ABDOMEN: tender to palpation in the L flank, no guarding, no peritoneal signs, and nondistended. BS +. No masses. EXTREMITIES: Without any cyanosis, clubbing, rash, lesions or edema. NEUROLOGIC: AOx3, no focal motor deficit. SKIN: no jaundice, no rashes  Imaging/Labs: as above  I personally reviewed and interpreted the available labs, imaging and endoscopic files.  Impression and Plan: Summer Hughes is a 42 y.o. female with past medical history of GERD, who presents for follow up of abdominal pain.  The patient has presented persistent pain in her left flank which has not improved with the use of dicyclomine.  She has not presented any other red flags chronically but recently she presented new onset rectal bleeding.  It is unclear if this is related to her abdominal pain but given the persistence of pain, we will explore this further with an EGD and a colonoscopy which she is agreeable to proceed with.  For now, she can switch her dicyclomine to Levsin as needed to achieve symptom improvement.  If her symptoms do not improve, we will proceed with a repeat CT of the abdomen pelvis with IV contrast.  Finally, her GERD symptoms have been better controlled with the use of omeprazole which she can continue taking on a daily basis.  -Schedule EGD and colonoscopy -Continue omeprazole 40 mg qday -Start Levsin 1 tablet q8h as  needed for abdominal pain -If presenting persistent persisting pain, will repeat CT abdomen/pelvis and may consider surgical consultation  All questions were answered.      Maylon Peppers, MD Gastroenterology and Hepatology [facility]

## 2022-09-17 NOTE — H&P (View-Only) (Signed)
Maylon Peppers, M.D. Gastroenterology & Hepatology [facility] [address] [place], [place] [address]  Primary Care Physician: Carlis Abbott, NP [place] [address]  I will communicate my assessment and recommendations to the referring MD via EMR.  Problems: L sided abdominal pain GERD Rectal bleeding  History of Present Illness: Summer Hughes is a 42 y.o. female with past medical history of GERD, who presents for follow up of abdominal pain.  The patient was last seen on 04/09/2022. At that time, the patient was advised to follow with general surgery and to take Bentyl as needed, which was mildly helping her pain.  She did not follow with the general surgeon as she was not sure if she wants a surgical evaluation at that time. She also reports that she also restarted omeprazole 40 mg qday which controls heartburn, denies dysphagia or odynophagia.  Since her last visit, she reports that she has constant pain in her L flank, now radiating to her back. States that when she eats spicy food, it may get worse.She reports that she was advised with her PCP to have a repeat CT scan but she wanted to discuss it first further with me .  She also feels "her bowels are working too slow", as if she eats late she will have a BM next day. She feels discomfort in her abdomen if this happens.  States that last Thursday she noticed recurrent episodes of rectal bleeding. She describes it as fresh blood in stool. She states that the blood has ben less frequent for the last few days but still present.  The patient denies having any nausea, vomiting, fever, chills, hematochezia, melena, hematemesis, abdominal distention, abdominal pain, diarrhea, jaundice, pruritus or weight loss.  Last EGD: Never Last Colonoscopy: Never  Past Medical History: Past Medical History:  Diagnosis Date   Abdominal pain, left upper quadrant  10/21/2017   Acid reflux     Past Surgical History: Past Surgical History:  Procedure Laterality Date   CHOLECYSTECTOMY     TUBAL LIGATION      Family History: Family History  Problem Relation Age of Onset   Healthy Father    Healthy Mother     Social History: Social History   Tobacco Use  Smoking Status Never   Passive exposure: Never  Smokeless Tobacco Never   Social History   Substance and Sexual Activity  Alcohol Use No   Social History   Substance and Sexual Activity  Drug Use No    Allergies: No Known Allergies  Medications: Current Outpatient Medications  Medication Sig Dispense Refill   ibuprofen (ADVIL) 800 MG tablet Take 800 mg by mouth every 8 (eight) hours as needed.     omeprazole (PRILOSEC) 40 MG capsule TAKE 1 CAPSULE BY MOUTH DAILY 90 capsule 1   dicyclomine (BENTYL) 10 MG capsule Take 1 capsule (10 mg total) by mouth every 12 (twelve) hours as needed for spasms. Tome Bentyl 1 tableta cada 12 horas a necesidad si presenta dolor abdominal (Patient not taking: Reported on 09/17/2022) 60 capsule 1   No current facility-administered medications for this visit.    Review of Systems: GENERAL: negative for malaise, night sweats HEENT: No changes in hearing or vision, no nose bleeds or other nasal problems. NECK: Negative for lumps, goiter, pain and significant neck swelling RESPIRATORY: Negative for cough, wheezing CARDIOVASCULAR: Negative for chest pain, leg swelling, palpitations, orthopnea GI: SEE HPI MUSCULOSKELETAL: Negative for joint pain or  swelling, back pain, and muscle pain. SKIN: Negative for lesions, rash PSYCH: Negative for sleep disturbance, mood disorder and recent psychosocial stressors. HEMATOLOGY Negative for prolonged bleeding, bruising easily, and swollen nodes. ENDOCRINE: Negative for cold or heat intolerance, polyuria, polydipsia and goiter. NEURO: negative for tremor, gait imbalance, syncope and seizures. The remainder of  the review of systems is noncontributory.   Physical Exam: BP 123/85 (BP Location: Left Arm, Patient Position: Sitting, Cuff Size: Large)   Pulse (!) 101   Temp 97.8 F (36.6 C) (Temporal)   Ht 5' 2"$  (1.575 m)   Wt 185 lb 11.2 oz (84.2 kg)   BMI 33.96 kg/m  GENERAL: The patient is AO x3, in no acute distress. HEENT: Head is normocephalic and atraumatic. EOMI are intact. Mouth is well hydrated and without lesions. NECK: Supple. No masses LUNGS: Clear to auscultation. No presence of rhonchi/wheezing/rales. Adequate chest expansion HEART: RRR, normal s1 and s2. ABDOMEN: tender to palpation in the L flank, no guarding, no peritoneal signs, and nondistended. BS +. No masses. EXTREMITIES: Without any cyanosis, clubbing, rash, lesions or edema. NEUROLOGIC: AOx3, no focal motor deficit. SKIN: no jaundice, no rashes  Imaging/Labs: as above  I personally reviewed and interpreted the available labs, imaging and endoscopic files.  Impression and Plan: Summer Hughes is a 42 y.o. female with past medical history of GERD, who presents for follow up of abdominal pain.  The patient has presented persistent pain in her left flank which has not improved with the use of dicyclomine.  She has not presented any other red flags chronically but recently she presented new onset rectal bleeding.  It is unclear if this is related to her abdominal pain but given the persistence of pain, we will explore this further with an EGD and a colonoscopy which she is agreeable to proceed with.  For now, she can switch her dicyclomine to Levsin as needed to achieve symptom improvement.  If her symptoms do not improve, we will proceed with a repeat CT of the abdomen pelvis with IV contrast.  Finally, her GERD symptoms have been better controlled with the use of omeprazole which she can continue taking on a daily basis.  -Schedule EGD and colonoscopy -Continue omeprazole 40 mg qday -Start Levsin 1 tablet q8h as  needed for abdominal pain -If presenting persistent persisting pain, will repeat CT abdomen/pelvis and may consider surgical consultation  All questions were answered.      Maylon Peppers, MD Gastroenterology and Hepatology [facility]

## 2022-09-17 NOTE — Patient Instructions (Addendum)
Schedule EGD and colonoscopy Continue omeprazole 40 mg qday Start Levsin 1 tablet q8h as needed for abdominal pain If presenting persistent persisting pain, will reepat CT abdomen/pelvis and may consider surgical consultation

## 2022-10-04 ENCOUNTER — Ambulatory Visit (HOSPITAL_COMMUNITY): Payer: BC Managed Care – PPO | Admitting: Anesthesiology

## 2022-10-04 ENCOUNTER — Ambulatory Visit (HOSPITAL_COMMUNITY)
Admission: RE | Admit: 2022-10-04 | Discharge: 2022-10-04 | Disposition: A | Discharge status: Home / Self Care | Payer: BC Managed Care – PPO | Attending: Gastroenterology | Admitting: Gastroenterology

## 2022-10-04 ENCOUNTER — Other Ambulatory Visit: Payer: Self-pay

## 2022-10-04 ENCOUNTER — Encounter (HOSPITAL_COMMUNITY)
Admission: RE | Disposition: A | Discharge status: Home / Self Care | Payer: Self-pay | Source: Home / Self Care | Attending: Gastroenterology

## 2022-10-04 ENCOUNTER — Encounter (HOSPITAL_COMMUNITY): Payer: Self-pay | Admitting: Gastroenterology

## 2022-10-04 DIAGNOSIS — R109 Unspecified abdominal pain: Secondary | ICD-10-CM | POA: Diagnosis present

## 2022-10-04 DIAGNOSIS — K449 Diaphragmatic hernia without obstruction or gangrene: Secondary | ICD-10-CM | POA: Insufficient documentation

## 2022-10-04 DIAGNOSIS — K648 Other hemorrhoids: Secondary | ICD-10-CM | POA: Insufficient documentation

## 2022-10-04 DIAGNOSIS — R1012 Left upper quadrant pain: Secondary | ICD-10-CM | POA: Diagnosis not present

## 2022-10-04 DIAGNOSIS — K625 Hemorrhage of anus and rectum: Secondary | ICD-10-CM

## 2022-10-04 DIAGNOSIS — K219 Gastro-esophageal reflux disease without esophagitis: Secondary | ICD-10-CM | POA: Insufficient documentation

## 2022-10-04 DIAGNOSIS — Z79899 Other long term (current) drug therapy: Secondary | ICD-10-CM | POA: Diagnosis not present

## 2022-10-04 HISTORY — PX: BIOPSY: SHX5522

## 2022-10-04 HISTORY — PX: COLONOSCOPY WITH PROPOFOL: SHX5780

## 2022-10-04 HISTORY — PX: ESOPHAGOGASTRODUODENOSCOPY (EGD) WITH PROPOFOL: SHX5813

## 2022-10-04 LAB — POCT PREGNANCY, URINE: Preg Test, Ur: NEGATIVE

## 2022-10-04 SURGERY — COLONOSCOPY WITH PROPOFOL
Anesthesia: General

## 2022-10-04 MED ORDER — LIDOCAINE HCL (CARDIAC) PF 100 MG/5ML IV SOSY
PREFILLED_SYRINGE | INTRAVENOUS | Status: DC | PRN
  Administered 2022-10-04: 50 mg via INTRAVENOUS

## 2022-10-04 MED ORDER — PROPOFOL 500 MG/50ML IV EMUL
INTRAVENOUS | Status: AC
  Filled 2022-10-04: qty 50

## 2022-10-04 MED ORDER — PROPOFOL 500 MG/50ML IV EMUL
INTRAVENOUS | Status: DC | PRN
  Administered 2022-10-04: 200 ug/kg/min via INTRAVENOUS

## 2022-10-04 MED ORDER — LIDOCAINE HCL (PF) 2 % IJ SOLN
INTRAMUSCULAR | Status: AC
  Filled 2022-10-04: qty 5

## 2022-10-04 MED ORDER — PROPOFOL 10 MG/ML IV BOLUS
INTRAVENOUS | Status: DC | PRN
  Administered 2022-10-04: 100 mg via INTRAVENOUS

## 2022-10-04 MED ORDER — LACTATED RINGERS IV SOLN: INTRAVENOUS | Status: DC

## 2022-10-04 NOTE — Op Note (Signed)
[facility] Patient Name: Summer Hughes Procedure Date: 10/04/2022 8:58 AM MRN: [id number] Date of Birth: 04-18-1981 Attending MD: Maylon Peppers , , [id number] CSN: [id number] Age: 42 Admit Type: Outpatient Procedure:                Colonoscopy Indications:              Rectal bleeding Providers:                Maylon Peppers, Janeece Riggers, RN, Ladoris Gene                            Technician, Technician Referring MD:              Medicines:                Monitored Anesthesia Care Complications:            No immediate complications. Estimated Blood Loss:     Estimated blood loss: none. Procedure:                Pre-Anesthesia Assessment:                           - Prior to the procedure, a History and Physical                            was performed, and patient medications, allergies                            and sensitivities were reviewed. The patient's                            tolerance of previous anesthesia was reviewed.                           - The risks and benefits of the procedure and the                            sedation options and risks were discussed with the                            patient. All questions were answered and informed                            consent was obtained.                           - ASA Grade Assessment: I - A normal, healthy                            patient.                           After obtaining informed consent, the colonoscope                            was passed under direct vision. Throughout the  procedure, the patient's blood pressure, pulse, and                            oxygen saturations were monitored continuously. The                            [id number]-[id number]) scope was introduced through                            the anus and advanced to the the cecum, identified                            by appendiceal orifice and ileocecal valve. The                             [id number] scope was introduced through                            the and advanced to the. The colonoscopy was                            performed without difficulty. The patient tolerated                            the procedure well. The quality of the bowel                            preparation was excellent. Scope In: 9:26:22 AM Scope Out: 9:36:48 AM Scope Withdrawal Time: 0 hours 6 minutes 59 seconds  Total Procedure Duration: 0 hours 10 minutes 26 seconds  Findings:      The perianal and digital rectal examinations were normal.      The entire examined colon appeared normal.      Non-bleeding internal hemorrhoids were found during retroflexion. The       hemorrhoids were small. Impression:               - The entire examined colon is normal.                           - Non-bleeding internal hemorrhoids.                           - No specimens collected. Moderate Sedation:      Per Anesthesia Care Recommendation:           - Discharge patient to home (ambulatory).                           - Resume previous diet.                           - Repeat colonoscopy in 10 years for screening                            purposes.                           -  Follow up with Dr. Arnoldo Morale regarding epiploic                            appendagitis. Procedure Code(s):        --- Professional ---                           [id number], Colonoscopy, flexible; diagnostic, including                            collection of specimen(s) by brushing or washing,                            when performed (separate procedure) Diagnosis Code(s):        --- Professional ---                           K64.8, Other hemorrhoids                           K62.5, Hemorrhage of anus and rectum CPT copyright 2022 American Medical Association. All rights reserved. The codes documented in this report are preliminary and upon coder review may  be revised to meet current compliance requirements. Maylon Peppers, MD Maylon Peppers,  10/04/2022 9:40:27 AM This report has been signed electronically. Number of Addenda: 0

## 2022-10-04 NOTE — Anesthesia Preprocedure Evaluation (Signed)
Anesthesia Evaluation  Patient identified by MRN, date of birth, ID band Patient awake    Reviewed: Allergy & Precautions, H&P , NPO status , Patient's Chart, lab work & pertinent test results, reviewed documented beta blocker date and time   Airway Mallampati: II  TM Distance: >3 FB Neck ROM: full    Dental no notable dental hx.    Pulmonary neg pulmonary ROS   Pulmonary exam normal breath sounds clear to auscultation       Cardiovascular Exercise Tolerance: Good negative cardio ROS  Rhythm:regular Rate:Normal     Neuro/Psych negative neurological ROS  negative psych ROS   GI/Hepatic negative GI ROS, Neg liver ROS,GERD  ,,  Endo/Other  negative endocrine ROS    Renal/GU negative Renal ROS  negative genitourinary   Musculoskeletal   Abdominal   Peds  Hematology negative hematology ROS (+)   Anesthesia Other Findings   Reproductive/Obstetrics negative OB ROS                             Anesthesia Physical Anesthesia Plan  ASA: 2  Anesthesia Plan: General   Post-op Pain Management:    Induction:   PONV Risk Score and Plan: Propofol infusion  Airway Management Planned:   Additional Equipment:   Intra-op Plan:   Post-operative Plan:   Informed Consent: I have reviewed the patients History and Physical, chart, labs and discussed the procedure including the risks, benefits and alternatives for the proposed anesthesia with the patient or authorized representative who has indicated his/her understanding and acceptance.     Dental Advisory Given  Plan Discussed with: CRNA  Anesthesia Plan Comments:        Anesthesia Quick Evaluation

## 2022-10-04 NOTE — Discharge Instructions (Addendum)
You are being discharged to home.  Resume your previous diet.  We are waiting for your pathology results.  Continue your present medications.  Follow up with Dr. Arnoldo Morale regarding epiploic appendagitis. Your physician has recommended a repeat colonoscopy in 10 years for screening purposes.

## 2022-10-04 NOTE — Transfer of Care (Signed)
Immediate Anesthesia Transfer of Care Note  Patient: Summer Hughes  Procedure(s) Performed: COLONOSCOPY WITH PROPOFOL ESOPHAGOGASTRODUODENOSCOPY (EGD) WITH PROPOFOL BIOPSY  Patient Location: Endoscopy Unit  Anesthesia Type:General  Level of Consciousness: drowsy  Airway & Oxygen Therapy: Patient Spontanous Breathing  Post-op Assessment: Report given to RN and Post -op Vital signs reviewed and stable  Post vital signs: Reviewed and stable  Last Vitals:  Vitals Value Taken Time  BP 106/62 10/04/22 0939  Temp 36.6 C 10/04/22 0939  Pulse 85 10/04/22 0939  Resp 13 10/04/22 0939  SpO2 99 % 10/04/22 0939    Last Pain:  Vitals:   10/04/22 0939  TempSrc: Oral  PainSc:       Patients Stated Pain Goal: 8 ([id number])  Complications: No notable events documented.

## 2022-10-04 NOTE — Op Note (Signed)
[facility] Patient Name: Summer Hughes Procedure Date: 10/04/2022 9:00 AM MRN: [id number] Date of Birth: Jan 29, 1981 Attending MD: Maylon Peppers , , [id number] CSN: [id number] Age: 42 Admit Type: Outpatient Procedure:                Upper GI endoscopy Indications:              Abdominal pain in the left upper quadrant Providers:                Maylon Peppers, Janeece Riggers, RN, Ladoris Gene                            Technician, Technician Referring MD:              Medicines:                Monitored Anesthesia Care Complications:            No immediate complications. Estimated Blood Loss:     Estimated blood loss: none. Procedure:                Pre-Anesthesia Assessment:                           - Prior to the procedure, a History and Physical                            was performed, and patient medications, allergies                            and sensitivities were reviewed. The patient's                            tolerance of previous anesthesia was reviewed.                           - The risks and benefits of the procedure and the                            sedation options and risks were discussed with the                            patient. All questions were answered and informed                            consent was obtained.                           - ASA Grade Assessment: I - A normal, healthy                            patient.                           After obtaining informed consent, the endoscope was                            passed under direct vision. Throughout the  procedure, the patient's blood pressure, pulse, and                            oxygen saturations were monitored continuously. The                            [id number] scope was introduced through the                            mouth, and advanced to the second part of duodenum.                            The upper GI endoscopy was accomplished  without                            difficulty. The patient tolerated the procedure                            well. Scope In: 9:12:37 AM Scope Out: 9:20:09 AM Total Procedure Duration: 0 hours 7 minutes 32 seconds  Findings:      A 1 cm hiatal hernia was present.      The gastroesophageal flap valve was visualized endoscopically and       classified as Hill Grade II (fold present, opens with respiration).      The entire examined stomach was normal. Biopsies were taken with a cold       forceps for Helicobacter pylori testing.      The examined duodenum was normal. Impression:               - 1 cm hiatal hernia.                           - Normal stomach. Biopsied.                           - Normal examined duodenum. Moderate Sedation:      Per Anesthesia Care Recommendation:           - Discharge patient to home (ambulatory).                           - Resume previous diet.                           - Await pathology results.                           - Continue present medications. Procedure Code(s):        --- Professional ---                           [phone], Esophagogastroduodenoscopy, flexible,                            transoral; with biopsy, single or multiple Diagnosis Code(s):        --- Professional ---  K44.9, Diaphragmatic hernia without obstruction or                            gangrene                           R10.12, Left upper quadrant pain CPT copyright 2022 American Medical Association. All rights reserved. The codes documented in this report are preliminary and upon coder review may  be revised to meet current compliance requirements. Maylon Peppers, MD Maylon Peppers,  10/04/2022 9:38:09 AM This report has been signed electronically. Number of Addenda: 0

## 2022-10-04 NOTE — Interval H&P Note (Signed)
History and Physical Interval Note:  10/04/2022 9:01 AM  Summer Hughes  has presented today for surgery, with the diagnosis of rectal bleeding, abdominal pain.  The various methods of treatment have been discussed with the patient and family. After consideration of risks, benefits and other options for treatment, the patient has consented to  Procedure(s) with comments: COLONOSCOPY WITH PROPOFOL (N/A) - 9:45am, asa 1-2 ESOPHAGOGASTRODUODENOSCOPY (EGD) WITH PROPOFOL (N/A) as a surgical intervention.  The patient's history has been reviewed, patient examined, no change in status, stable for surgery.  I have reviewed the patient's chart and labs.  Questions were answered to the patient's satisfaction.     Maylon Peppers Mayorga

## 2022-10-05 LAB — SURGICAL PATHOLOGY

## 2022-10-06 NOTE — Anesthesia Postprocedure Evaluation (Signed)
Anesthesia Post Note  Patient: Kealy Snopek  Procedure(s) Performed: COLONOSCOPY WITH PROPOFOL ESOPHAGOGASTRODUODENOSCOPY (EGD) WITH PROPOFOL BIOPSY  Patient location during evaluation: Phase II Anesthesia Type: General Level of consciousness: awake Pain management: pain level controlled Vital Signs Assessment: post-procedure vital signs reviewed and stable Respiratory status: spontaneous breathing and respiratory function stable Cardiovascular status: blood pressure returned to baseline and stable Postop Assessment: no headache and no apparent nausea or vomiting Anesthetic complications: no Comments: Late entry   No notable events documented.   Last Vitals:  Vitals:   10/04/22 0942 10/04/22 0946  BP:  106/69  Pulse: 83 82  Resp: 15 17  Temp:    SpO2: 100% 100%    Last Pain:  Vitals:   10/04/22 0946  TempSrc:   PainSc: 0-No pain                 Louann Sjogren

## 2022-10-10 ENCOUNTER — Encounter (HOSPITAL_COMMUNITY): Payer: Self-pay | Admitting: Gastroenterology

## 2022-12-18 ENCOUNTER — Encounter: Payer: Self-pay | Admitting: General Surgery

## 2022-12-18 ENCOUNTER — Ambulatory Visit (INDEPENDENT_AMBULATORY_CARE_PROVIDER_SITE_OTHER): Payer: BC Managed Care – PPO | Admitting: General Surgery

## 2022-12-18 VITALS — BP 121/82 | HR 94 | Temp 98.4°F | Resp 12 | Ht 62.0 in | Wt 183.0 lb

## 2022-12-18 DIAGNOSIS — K6389 Other specified diseases of intestine: Secondary | ICD-10-CM

## 2022-12-18 DIAGNOSIS — R1032 Left lower quadrant pain: Secondary | ICD-10-CM

## 2022-12-19 NOTE — Progress Notes (Signed)
Subjective:     Summer Hughes  Patient is a 42 year old Hispanic female who was referred back to my care by Jayme Cloud for ongoing left lower quadrant abdominal pain.  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  She has been evaluated by gastroenterology and workup has been negative otherwise.  She continues to have intermittent episodes of left lower quadrant abdominal pain.  It does not radiate.  She denies any nausea or vomiting.  She is taken NSAIDs intermittently.  She does not get relief of her pain.  History is obtained through an interpreter. Objective:    BP 121/82   Pulse 94   Temp 98.4 F (36.9 C) (Oral)   Resp 12   Ht 5\' 2"  (1.575 m)   Wt 183 lb (83 kg)   SpO2 98%   BMI 33.47 kg/m   General:  alert, cooperative, and no distress  Head is normocephalic, atraumatic Lungs clear to auscultation with equal breath sounds bilaterally Heart examination reveals a regular rate and rhythm without S3, S4, murmurs Abdomen is soft with intermittent tenderness to palpation in the left lower quadrant.  No rigidity is noted.  Previous office note reviewed     Assessment:    Left lower quadrant abdominal pain possibly secondary to epiploic appendagitis    Plan:   I explained to the patient that usually surgery is not required to treat this issue.  As she is having ongoing left lower quadrant abdominal pain, will repeat the CT scan of the abdomen and pelvis.  She should follow-up with me after the CT scan is done.  She may require laparoscopic evaluation.  Further management is pending CT scan results.  All questions were answered.  Literature was given.

## 2022-12-21 ENCOUNTER — Ambulatory Visit (HOSPITAL_COMMUNITY)
Admission: RE | Admit: 2022-12-21 | Discharge: 2022-12-21 | Disposition: A | Discharge status: Home / Self Care | Payer: BC Managed Care – PPO | Source: Ambulatory Visit | Attending: General Surgery | Admitting: General Surgery

## 2022-12-21 DIAGNOSIS — R109 Unspecified abdominal pain: Secondary | ICD-10-CM | POA: Diagnosis not present

## 2022-12-21 DIAGNOSIS — R1032 Left lower quadrant pain: Secondary | ICD-10-CM | POA: Insufficient documentation

## 2022-12-21 DIAGNOSIS — K760 Fatty (change of) liver, not elsewhere classified: Secondary | ICD-10-CM | POA: Diagnosis not present

## 2022-12-21 MED ORDER — IOHEXOL 9 MG/ML PO SOLN: 500.0000 mL | ORAL | Status: AC

## 2022-12-21 MED ORDER — IOHEXOL 350 MG/ML SOLN
75.0000 mL | Freq: Once | INTRAVENOUS | Status: AC | PRN
  Administered 2022-12-21: 75 mL via INTRAVENOUS

## 2022-12-25 ENCOUNTER — Ambulatory Visit (INDEPENDENT_AMBULATORY_CARE_PROVIDER_SITE_OTHER): Payer: BC Managed Care – PPO | Admitting: General Surgery

## 2022-12-25 ENCOUNTER — Encounter: Payer: Self-pay | Admitting: General Surgery

## 2022-12-25 VITALS — BP 120/81 | HR 120 | Temp 98.9°F | Resp 14 | Ht 62.0 in | Wt 183.0 lb

## 2022-12-25 DIAGNOSIS — R1032 Left lower quadrant pain: Secondary | ICD-10-CM

## 2022-12-25 NOTE — Progress Notes (Signed)
Subjective:     Summer Hughes  Patient here to follow-up with her CT scan report.  I told her that her follow-up CAT scan did not show any evidence of epiploic appendagitis.  She states she does have intermittent episodes of diarrhea or constipation and the pain radiates from her left lower quadrant to her rectum.  She has already had endoscopy by GI.  Her husband was present during examination and interpreted the results to her. Objective:    BP 120/81   Pulse (!) 120   Temp 98.9 F (37.2 C) (Oral)   Resp 14   Ht 5\' 2"  (1.575 m)   Wt 183 lb (83 kg)   SpO2 96%   BMI 33.47 kg/m   General:  alert, cooperative, and no distress       Assessment:    Left lower quadrant abdominal pain of unknown etiology. Epiploic appendagitis seems to have resolved.  No surgical explanation for the patient's left lower quadrant abdominal pain.  This all be related to her bowels such as IBD.   Plan:   Patient already has a follow-up with gastroenterology on 01/17/2023.  Follow-up here as needed.

## 2023-01-17 ENCOUNTER — Ambulatory Visit (INDEPENDENT_AMBULATORY_CARE_PROVIDER_SITE_OTHER): Payer: BC Managed Care – PPO | Admitting: Gastroenterology

## 2023-01-17 ENCOUNTER — Encounter (INDEPENDENT_AMBULATORY_CARE_PROVIDER_SITE_OTHER): Payer: Self-pay | Admitting: Gastroenterology

## 2023-01-17 VITALS — BP 117/78 | HR 84 | Temp 97.1°F | Ht 62.0 in | Wt 185.5 lb

## 2023-01-17 DIAGNOSIS — K219 Gastro-esophageal reflux disease without esophagitis: Secondary | ICD-10-CM | POA: Diagnosis not present

## 2023-01-17 DIAGNOSIS — R1012 Left upper quadrant pain: Secondary | ICD-10-CM

## 2023-01-17 DIAGNOSIS — R1032 Left lower quadrant pain: Secondary | ICD-10-CM | POA: Diagnosis not present

## 2023-01-17 NOTE — Progress Notes (Addendum)
Referring Provider: Dillard Essex, NP Primary Care Physician:  Dillard Essex, NP Primary GI Physician: Levon Hedger   Chief Complaint  Patient presents with   Follow-up    Patient here today for a follow up appointment. Per patient she is still having issues with left flank pain. Patient is still taking omeprazole 40 mg once daily as needed for GERD/indigestion. Patient denies any other gi issue today.    HPI:   Summer Hughes is a 42 y.o. female with past medical history of GERD   Patient presenting today for follow up opf GERD, Left sided abdominal pain.  Last seen January 2024, at that time noting constant pain in her L flank, radiating to her back. States that when she eats spicy food, it may get worse.was advised with her PCP to have a repeat CT scan but she wanted to discuss it first further with Dr. Levon Hedger. She felt "her bowels were working too slow", as if she eats late she will have a BM next day. felt discomfort in her abdomen when that occurred. Reported on the previous Thursday she noticed recurrent episodes of rectal bleeding. She describes it as fresh blood in stool.  blood had been less frequent for the last few days prior to her visit but still present.  Recommended to continue omeprazole 40mg  daily, stat levsin q8h, schedule EGD/colonoscopy, consider repeat CT A/P/surgical consult if pain continued.  Patient advised to follow up with Dr. Lovell Sheehan regarding epipoloic appendagitis seen on CT imaging 01/01/22, she saw him on 5/7, epiploic appendagitis had resolved and no surgical explanation for patient's LLQ pain.   Present:   Patient reports she is having LLQ pain. Pain seems to always be there but certain foods make it worse. Spicy foods tend to make her pain worse. She also notes that greasy foods make the pain worse. She notes that the pain seems to radiate around to her rectum but she denies any pain with defecation. Notes pain more when sitting down. Nothing  seems to improve her pain but not eating the trigger foods seems to help it be less. She took levsin previously but did not feel that it helped so she stopped it. She denies any constipation or diarrhea. She denies rectal bleeding, states that she only has this if she takes an antibiotic. No melena. Weight is stable. She denies nausea or vomiting. Denies early satiety. She feels that omeprazole takes care of her GERD symptoms. Denies any breakthrough.   She does note that she has urinary frequency since her LLQ pain began. She notes urinary urgency at times when the pain is worse. Denies hematuria or foul odor to her urine   She states that she was diagnosed with UC by a doctor in [place] a few years ago, though notably tells me she has never had a colonoscopy until this year (which did not show evidence of UC).  Last Colonoscopy: 09/2022- The entire examined colon is normal.                           - Non-bleeding internal hemorrhoids.                           - No specimens collected. Last Endoscopy:-09/2022 1 cm hiatal hernia.                           - Normal  stomach. Biopsied-normal                            - Normal examined duodenum.  Recommendations:    Past Medical History:  Diagnosis Date   Abdominal pain, left upper quadrant 10/21/2017   Acid reflux     Past Surgical History:  Procedure Laterality Date   BIOPSY  10/04/2022   Procedure: BIOPSY;  Surgeon: Dolores Frame, MD;  [facility];  Service: Gastroenterology;;   CHOLECYSTECTOMY     COLONOSCOPY WITH PROPOFOL N/A 10/04/2022   Procedure: COLONOSCOPY WITH PROPOFOL;  Surgeon: Dolores Frame, MD;  [facility];  Service: Gastroenterology;  Laterality: N/A;  9:45am, asa 1-2   ESOPHAGOGASTRODUODENOSCOPY (EGD) WITH PROPOFOL N/A 10/04/2022   Procedure: ESOPHAGOGASTRODUODENOSCOPY (EGD) WITH PROPOFOL;  Surgeon: Dolores Frame, MD;  [facility];  Service:  Gastroenterology;  Laterality: N/A;   TUBAL LIGATION      Current Outpatient Medications  Medication Sig Dispense Refill   Cholecalciferol (VITAMIN D-3 PO) Take 1 tablet by mouth in the morning.     ibuprofen (ADVIL) 800 MG tablet Take 800 mg by mouth every 8 (eight) hours as needed (pain.).     omeprazole (PRILOSEC) 40 MG capsule Take 1 capsule (40 mg total) by mouth daily. (Patient taking differently: Take 40 mg by mouth daily as needed (indigestion/heartburn).) 90 capsule 3   No current facility-administered medications for this visit.    Allergies as of 01/17/2023   (No Known Allergies)    Family History  Problem Relation Age of Onset   Healthy Father    Healthy Mother     Social History   Socioeconomic History   Marital status: Married    Spouse name: Not on file   Number of children: Not on file   Years of education: Not on file   Highest education level: Not on file  Occupational History   Not on file  Tobacco Use   Smoking status: Never    Passive exposure: Never   Smokeless tobacco: Never  Vaping Use   Vaping Use: Never used  Substance and Sexual Activity   Alcohol use: No   Drug use: No   Sexual activity: Yes    Birth control/protection: Implant  Other Topics Concern   Not on file  Social History Narrative   Not on file   Social Determinants of Health   Financial Resource Strain: Not on file  Food Insecurity: Not on file  Transportation Needs: Not on file  Physical Activity: Not on file  Stress: Not on file  Social Connections: Not on file    Review of systems General: negative for malaise, night sweats, fever, chills, weight loss Neck: Negative for lumps, goiter, pain and significant neck swelling Resp: Negative for cough, wheezing, dyspnea at rest CV: Negative for chest pain, leg swelling, palpitations, orthopnea GI: denies melena, hematochezia, nausea, vomiting, diarrhea, constipation, dysphagia, odyonophagia, early satiety or unintentional  weight loss. +LLQ pain MSK: Negative for joint pain or swelling, back pain, and muscle pain. Derm: Negative for itching or rash Psych: Denies depression, anxiety, memory loss, confusion. No homicidal or suicidal ideation.  Heme: Negative for prolonged bleeding, bruising easily, and swollen nodes. Endocrine: Negative for cold or heat intolerance, polyuria, polydipsia and goiter. Neuro: negative for tremor, gait imbalance, syncope and seizures. The remainder of the review of systems is noncontributory.  Physical Exam: BP 117/78 (BP Location: Left Arm, Patient  Position: Sitting, Cuff Size: Large)   Pulse 84   Temp (!) 97.1 F (36.2 C) (Temporal)   Ht 5\' 2"  (1.575 m)   Wt 185 lb 8 oz (84.1 kg)   BMI 33.93 kg/m  General:   Alert and oriented. No distress noted. Pleasant and cooperative.  Head:  Normocephalic and atraumatic. Eyes:  Conjuctiva clear without scleral icterus. Mouth:  Oral mucosa pink and moist. Good dentition. No lesions. Heart: Normal rate and rhythm, s1 and s2 heart sounds present.  Lungs: Clear lung sounds in all lobes. Respirations equal and unlabored. Abdomen:  +BS, soft, non-tender and non-distended. No rebound or guarding. No HSM or masses noted. Derm: No palmar erythema or jaundice Msk:  Symmetrical without gross deformities. Normal posture. Extremities:  Without edema. Neurologic:  Alert and  oriented x4 Psych:  Alert and cooperative. Normal mood and affect.  Invalid input(s): "6 MONTHS"   ASSESSMENT: Summer Hughes is a 42 y.o. female presenting today for Left lower quadrant pain  LLQ pain: Ongoing Left lower quadrant pain of unclear etiology. Recent Colonoscopy and CT imaging was unrevealing for cause of her symptoms. She notes pain is burning in nature, no associated alarm symptoms. Took levsin previously without improvement. Also notes some urinary frequency/urgency at times, though also feels spicy/greasy foods make her symptoms worse. Query if this  is bowel hypersensitivity in setting of possible IBS, however, given associated urinary symptoms and no clear GI etiology as cause of her pain, I did encourage her to have evaluation by OBGYN to rule out other underlying causes given location of her pain. Will discuss any further recommendations with Dr. Levon Hedger. Could possibly consider a trial of low dose TCA if no other identifying causes are determined.   GERD: well managed on omeprazole 40mg  daily, will continue with current regimen.    PLAN:  Continue with omeprazole 40mg  daily   2. Will discuss further recommendations with Dr. Salena Saner  3. Consider seeing OBGYN to rule out other causes of LLQ pain  All questions were answered, patient verbalized understanding and is in agreement with plan as outlined above.    Follow Up: 3 months   Kimball Manske L. Jeanmarie Hubert, MSN, APRN, AGNP-C Adult-Gerontology Nurse Practitioner [facility]  I have reviewed the note and agree with the APP's assessment as described in this progress note  Patient presented initially epiploic appendagitis which has resolved as seen on the most recent CT scan.  However has persisted with lower abdominal pain.  I agree with evaluation by OB/GYN, but if evaluation is unremarkable, will consider starting her on Elavil 10 mg every night.  Katrinka Blazing, MD Gastroenterology and Hepatology [facility] Gastroenterology

## 2023-01-17 NOTE — Patient Instructions (Signed)
Please continue with omeprazole 40mg  daily I would avoid spicy foods and other foods that worsen your pain I will discuss your case with Dr. Levon Hedger upon his return but it may also be a good idea to follow up with your OB GYN as well for them to rule out any causes on that end  Follow up 3 months

## 2023-01-20 DIAGNOSIS — R1032 Left lower quadrant pain: Secondary | ICD-10-CM | POA: Insufficient documentation

## 2023-03-11 DIAGNOSIS — Z79899 Other long term (current) drug therapy: Secondary | ICD-10-CM | POA: Diagnosis not present

## 2023-03-11 DIAGNOSIS — R252 Cramp and spasm: Secondary | ICD-10-CM | POA: Diagnosis not present

## 2023-03-11 DIAGNOSIS — Z0189 Encounter for other specified special examinations: Secondary | ICD-10-CM | POA: Diagnosis not present

## 2023-03-11 DIAGNOSIS — M25561 Pain in right knee: Secondary | ICD-10-CM | POA: Diagnosis not present

## 2023-03-11 DIAGNOSIS — K219 Gastro-esophageal reflux disease without esophagitis: Secondary | ICD-10-CM | POA: Diagnosis not present

## 2023-03-11 DIAGNOSIS — E669 Obesity, unspecified: Secondary | ICD-10-CM | POA: Diagnosis not present

## 2023-03-11 DIAGNOSIS — Z6833 Body mass index (BMI) 33.0-33.9, adult: Secondary | ICD-10-CM | POA: Diagnosis not present

## 2023-03-22 DIAGNOSIS — Z136 Encounter for screening for cardiovascular disorders: Secondary | ICD-10-CM | POA: Diagnosis not present

## 2023-03-22 DIAGNOSIS — R7301 Impaired fasting glucose: Secondary | ICD-10-CM | POA: Diagnosis not present

## 2023-03-22 DIAGNOSIS — Z6833 Body mass index (BMI) 33.0-33.9, adult: Secondary | ICD-10-CM | POA: Diagnosis not present

## 2023-04-02 ENCOUNTER — Ambulatory Visit (INDEPENDENT_AMBULATORY_CARE_PROVIDER_SITE_OTHER): Payer: BC Managed Care – PPO | Admitting: Gastroenterology

## 2023-04-02 DIAGNOSIS — K219 Gastro-esophageal reflux disease without esophagitis: Secondary | ICD-10-CM | POA: Diagnosis not present

## 2023-04-02 DIAGNOSIS — E559 Vitamin D deficiency, unspecified: Secondary | ICD-10-CM | POA: Diagnosis not present

## 2023-04-02 DIAGNOSIS — E669 Obesity, unspecified: Secondary | ICD-10-CM | POA: Diagnosis not present

## 2023-04-02 DIAGNOSIS — Z0001 Encounter for general adult medical examination with abnormal findings: Secondary | ICD-10-CM | POA: Diagnosis not present

## 2023-04-02 DIAGNOSIS — M25561 Pain in right knee: Secondary | ICD-10-CM | POA: Diagnosis not present

## 2023-04-02 DIAGNOSIS — Z6833 Body mass index (BMI) 33.0-33.9, adult: Secondary | ICD-10-CM | POA: Diagnosis not present

## 2023-04-02 DIAGNOSIS — M25562 Pain in left knee: Secondary | ICD-10-CM | POA: Diagnosis not present

## 2023-04-02 DIAGNOSIS — Z01419 Encounter for gynecological examination (general) (routine) without abnormal findings: Secondary | ICD-10-CM | POA: Diagnosis not present

## 2023-04-09 DIAGNOSIS — R079 Chest pain, unspecified: Secondary | ICD-10-CM | POA: Diagnosis not present

## 2023-04-09 DIAGNOSIS — G43009 Migraine without aura, not intractable, without status migrainosus: Secondary | ICD-10-CM | POA: Diagnosis not present

## 2023-05-27 ENCOUNTER — Ambulatory Visit (INDEPENDENT_AMBULATORY_CARE_PROVIDER_SITE_OTHER): Payer: BC Managed Care – PPO | Admitting: Gastroenterology

## 2023-09-21 ENCOUNTER — Other Ambulatory Visit (INDEPENDENT_AMBULATORY_CARE_PROVIDER_SITE_OTHER): Payer: Self-pay | Admitting: Gastroenterology

## 2023-09-21 DIAGNOSIS — K219 Gastro-esophageal reflux disease without esophagitis: Secondary | ICD-10-CM

## 2023-09-27 DIAGNOSIS — Z0000 Encounter for general adult medical examination without abnormal findings: Secondary | ICD-10-CM | POA: Diagnosis not present

## 2023-09-27 DIAGNOSIS — E559 Vitamin D deficiency, unspecified: Secondary | ICD-10-CM | POA: Diagnosis not present

## 2023-09-27 DIAGNOSIS — R7303 Prediabetes: Secondary | ICD-10-CM | POA: Diagnosis not present

## 2023-10-03 DIAGNOSIS — G43009 Migraine without aura, not intractable, without status migrainosus: Secondary | ICD-10-CM | POA: Diagnosis not present

## 2023-10-03 DIAGNOSIS — K219 Gastro-esophageal reflux disease without esophagitis: Secondary | ICD-10-CM | POA: Diagnosis not present

## 2023-10-03 DIAGNOSIS — M25561 Pain in right knee: Secondary | ICD-10-CM | POA: Diagnosis not present

## 2023-10-03 DIAGNOSIS — Z6833 Body mass index (BMI) 33.0-33.9, adult: Secondary | ICD-10-CM | POA: Diagnosis not present

## 2023-10-03 DIAGNOSIS — E669 Obesity, unspecified: Secondary | ICD-10-CM | POA: Diagnosis not present

## 2023-10-04 ENCOUNTER — Other Ambulatory Visit (HOSPITAL_COMMUNITY): Payer: Self-pay | Admitting: Family Medicine

## 2023-10-04 DIAGNOSIS — Z1231 Encounter for screening mammogram for malignant neoplasm of breast: Secondary | ICD-10-CM

## 2023-10-04 DIAGNOSIS — R1032 Left lower quadrant pain: Secondary | ICD-10-CM | POA: Diagnosis not present

## 2023-10-04 DIAGNOSIS — R252 Cramp and spasm: Secondary | ICD-10-CM | POA: Diagnosis not present

## 2023-10-14 ENCOUNTER — Encounter (HOSPITAL_COMMUNITY): Payer: Self-pay

## 2023-10-14 ENCOUNTER — Ambulatory Visit (HOSPITAL_COMMUNITY)
Admission: RE | Admit: 2023-10-14 | Discharge: 2023-10-14 | Disposition: A | Discharge status: Home / Self Care | Payer: BC Managed Care – PPO | Source: Ambulatory Visit | Attending: Family Medicine | Admitting: Family Medicine

## 2023-10-14 ENCOUNTER — Inpatient Hospital Stay (HOSPITAL_COMMUNITY): Admission: RE | Admit: 2023-10-14 | Payer: BC Managed Care – PPO | Source: Ambulatory Visit

## 2023-10-14 DIAGNOSIS — Z1231 Encounter for screening mammogram for malignant neoplasm of breast: Secondary | ICD-10-CM | POA: Insufficient documentation

## 2023-10-24 ENCOUNTER — Other Ambulatory Visit (INDEPENDENT_AMBULATORY_CARE_PROVIDER_SITE_OTHER): Payer: Self-pay | Admitting: Gastroenterology

## 2023-10-24 DIAGNOSIS — K219 Gastro-esophageal reflux disease without esophagitis: Secondary | ICD-10-CM

## 2023-10-24 NOTE — Telephone Encounter (Signed)
 Last office visit may 2024. That ov note states to follow up in 3 months.

## 2024-01-20 ENCOUNTER — Ambulatory Visit (INDEPENDENT_AMBULATORY_CARE_PROVIDER_SITE_OTHER): Admitting: Gastroenterology

## 2024-01-20 ENCOUNTER — Encounter (INDEPENDENT_AMBULATORY_CARE_PROVIDER_SITE_OTHER): Payer: Self-pay | Admitting: Gastroenterology

## 2024-01-20 VITALS — BP 120/81 | HR 97 | Temp 98.5°F | Ht 62.0 in | Wt 186.5 lb

## 2024-01-20 DIAGNOSIS — K219 Gastro-esophageal reflux disease without esophagitis: Secondary | ICD-10-CM | POA: Diagnosis not present

## 2024-01-20 DIAGNOSIS — R1012 Left upper quadrant pain: Secondary | ICD-10-CM | POA: Diagnosis not present

## 2024-01-20 DIAGNOSIS — K649 Unspecified hemorrhoids: Secondary | ICD-10-CM

## 2024-01-20 MED ORDER — SUCRALFATE 1 G PO TABS: 1.0000 g | ORAL_TABLET | Freq: Three times a day (TID) | ORAL | 0 refills | Status: DC

## 2024-01-20 MED ORDER — OMEPRAZOLE 40 MG PO CPDR: 40.0000 mg | DELAYED_RELEASE_CAPSULE | Freq: Every day | ORAL | 3 refills | Status: AC

## 2024-01-20 NOTE — Patient Instructions (Signed)
 Please continue omeprazole  40mg  daily Be mindful of  greasy, spicy, fried, citrus foods,  caffeine, carbonated drinks, chocolate and alcohol as these can increase reflux symptoms Stay upright 2-3 hours after eating, prior to lying down and avoid eating late in the evenings. I have sent carafate 1g tablets, please dissolve in 1/2 water mix and drink prior to meal and at bedtime, let me know if your stomach discomfort does not improve I am providing hemorrhoid banding brochure, let me know if you are interested in setting this up  Follow up 1 year  It was a pleasure to see you today. I want to create trusting relationships with patients and provide genuine, compassionate, and quality care. I truly value your feedback! please be on the lookout for a survey regarding your visit with me today. I appreciate your input about our visit and your time in completing this!    Summer Mayabb L. Tandy Lewin, MSN, APRN, AGNP-C Adult-Gerontology Nurse Practitioner [facility] Gastroenterology at [facility]

## 2024-01-20 NOTE — Progress Notes (Addendum)
 Referring Provider: Wandra Gustin, NP Primary Care Physician:  Wandra Gustin, NP Primary GI Physician: Dr. Sammi Crick   Chief Complaint  Patient presents with   Follow-up    Pt arrives for follow up. Last 2 weeks pt has had left side pain(same as last time). No nausea/no vomiting. Pt states the ibuprofen  may be cause of pain-she took ibuprofen  all week for a headache. Refill omeprazole    HPI:   Summer Hughes is a 43 y.o. female with past medical history of GERD  Patient presenting today for:  Follow up of GERD LUQ  Pain Hemorrhoids   Last seen may 2024, at that time havign some Left mid to lower abdominal pain worse with certain foods. Felt omeprazole  managing her GERD well.   Recommended Continue omeprazole  40mg  daily, consider evaluation by GYN for LLQ Pain, consider low dose elavil if no other causes for pain are found  Present: Patient's son present, helps to translate. Having some recent left mid to upper abdominal pain over the past few weeks, queries if related to some ibuprofen  for a week or two for headaches. She feels that pain is somewhat better than it was but still present. She feels that pain is present pretty consistently, some radiation around to her back. She did some warm tea/drank some aloe which seemed to help some. Sometimes she tries to do more liquids and avoid eating as much which seems to help. She also endorses that she had a lot of spicy foods prior to onset of pain which has precipitated LUQ pain in the past as well.  No nausea, vomiting, weight loss or early satiety. Denies melena. Endorses some occasional feeling of not emptying her bowels completely, sometimes defecation improves her pain a little bit. Sometimes passing gas is painful.  She tries to avoid spicy foods, red meat, caffeine as these seem to flare up her symptoms.   GERD feels well controlled when she takes her omeprazole .   She also endorses some issues with hemorrhoids that  cause her pain, interested in possible banding. She denies any rectal bleeding.  Last Colonoscopy: 09/2022- The entire examined colon is normal.                           - Non-bleeding internal hemorrhoids.                           - No specimens collected. Last Endoscopy:-09/2022 1 cm hiatal hernia.                           - Normal stomach. Biopsied-normal                            - Normal examined duodenum.   Filed Weights   01/20/24 0949  Weight: 186 lb 8 oz (84.6 kg)     Past Medical History:  Diagnosis Date   Abdominal pain, left upper quadrant 10/21/2017   Acid reflux     Past Surgical History:  Procedure Laterality Date   BIOPSY  10/04/2022   Procedure: BIOPSY;  Surgeon: Urban Garden, MD;  [facility];  Service: Gastroenterology;;   CHOLECYSTECTOMY     COLONOSCOPY WITH PROPOFOL  N/A 10/04/2022   Procedure: COLONOSCOPY WITH PROPOFOL ;  Surgeon: Urban Garden, MD;  [facility];  Service: Gastroenterology;  Laterality: N/A;  9:45am, asa 1-2   ESOPHAGOGASTRODUODENOSCOPY (EGD) WITH PROPOFOL  N/A 10/04/2022   Procedure: ESOPHAGOGASTRODUODENOSCOPY (EGD) WITH PROPOFOL ;  Surgeon: Urban Garden, MD;  [facility];  Service: Gastroenterology;  Laterality: N/A;   TUBAL LIGATION      Current Outpatient Medications  Medication Sig Dispense Refill   Cholecalciferol (VITAMIN D-3 PO) Take 1 tablet by mouth in the morning.     ibuprofen  (ADVIL ) 800 MG tablet Take 800 mg by mouth every 8 (eight) hours as needed (pain.).     omeprazole  (PRILOSEC) 40 MG capsule TAKE 1 CAPSULE BY MOUTH DAILY 90 capsule 0   No current facility-administered medications for this visit.    Allergies as of 01/20/2024   (No Known Allergies)    Social History   Socioeconomic History   Marital status: Married    Spouse name: Not on file   Number of children: Not on file   Years of education: Not on file   Highest education level: Not  on file  Occupational History   Not on file  Tobacco Use   Smoking status: Never    Passive exposure: Never   Smokeless tobacco: Never  Vaping Use   Vaping status: Never Used  Substance and Sexual Activity   Alcohol use: No   Drug use: No   Sexual activity: Yes    Birth control/protection: Implant  Other Topics Concern   Not on file  Social History Narrative   Not on file   Social Drivers of Health   Financial Resource Strain: Not on file  Food Insecurity: Not on file  Transportation Needs: Not on file  Physical Activity: Not on file  Stress: Not on file  Social Connections: Not on file    Review of systems General: negative for malaise, night sweats, fever, chills, weight loss Neck: Negative for lumps, goiter, pain and significant neck swelling Resp: Negative for cough, wheezing, dyspnea at rest CV: Negative for chest pain, leg swelling, palpitations, orthopnea GI: denies melena, hematochezia, nausea, vomiting, diarrhea, constipation, dysphagia, odyonophagia, early satiety or unintentional weight loss. +LUQ discomfort +hemorrhoids  MSK: Negative for joint pain or swelling, back pain, and muscle pain. Derm: Negative for itching or rash Psych: Denies depression, anxiety, memory loss, confusion. No homicidal or suicidal ideation.  Heme: Negative for prolonged bleeding, bruising easily, and swollen nodes. Endocrine: Negative for cold or heat intolerance, polyuria, polydipsia and goiter. Neuro: negative for tremor, gait imbalance, syncope and seizures. The remainder of the review of systems is noncontributory.  Physical Exam: BP 120/81   Pulse 97   Temp 98.5 F (36.9 C)   Ht 5\' 2"  (1.575 m)   Wt 186 lb 8 oz (84.6 kg)   BMI 34.11 kg/m  General:   Alert and oriented. No distress noted. Pleasant and cooperative.  Head:  Normocephalic and atraumatic. Eyes:  Conjuctiva clear without scleral icterus. Mouth:  Oral mucosa pink and moist. Good dentition. No lesions. Heart:  Normal rate and rhythm, s1 and s2 heart sounds present.  Lungs: Clear lung sounds in all lobes. Respirations equal and unlabored. Abdomen:  +BS, soft, and non-distended. Mild TTP of LUQ. No rebound or guarding. No HSM or masses noted. Derm: No palmar erythema or jaundice Msk:  Symmetrical without gross deformities. Normal posture. Extremities:  Without edema. Neurologic:  Alert and  oriented x4 Psych:  Alert and cooperative. Normal mood and affect.  Invalid input(s): "6 MONTHS"   ASSESSMENT: Summer Hughes is a 43 y.o. female presenting  today for follow up of GERD, LUQ pain and hemorrhoids  GERD: well controlled on omeprazole  40mg  daily. Will continue with PPI and good reflux precautions   LUQ pain: continues to have this intermittently, usually flared by spicy foods. Recently feels ibuprofen  flared pain up. She took almost daily for 1 week for headaches. Denies n/v, early satiety, changes in appetite, melena. Most recent EGD last year as outlined above was normal. Query some gastritis in setting of ibuprofen  use. At this time, no red flags to indicate need for endoscopic evaluation, will continue PPI daily, avoid NSAIDs, spicy foods and start course of carafate  1g QID, she should make me aware if symptoms fail to improve.   Hemorrhoids: she endorses some discomfort with hemorrhoids when traveling. Fairly recent TCS with hemorrhoids noted. We discussed hemorrhoid banding, brochure was provided to the patient.    PLAN:  -continue omeprazole  40mg  daily  -good reflux precautions -avoid NSAIDs -carafate  1g QID, dissolve in 1/2 oz water, mix and drink -pt to make me aware if LUQ fails to improve -consider hemorrhoid banding, brochure provided   Follow Up: 1 year   Summer Hughes L. Adrien Alberta, MSN, APRN, AGNP-C Adult-Gerontology Nurse Practitioner [facility] for GI Diseases  I have reviewed the note and agree with the APP's assessment as described in this progress note  Samantha Cress, MD Gastroenterology and Hepatology [facility] Gastroenterology

## 2024-02-06 ENCOUNTER — Other Ambulatory Visit (INDEPENDENT_AMBULATORY_CARE_PROVIDER_SITE_OTHER): Payer: Self-pay | Admitting: Gastroenterology

## 2024-02-07 NOTE — Telephone Encounter (Signed)
 I spoke with the patient and her interpretor Summer Hughes. Patient reports she is doing much better after taking this and would like a refill to keep on hand. Please advise.

## 2024-02-19 IMAGING — CT CT ABD-PELV W/ CM
2 of 5 series · 16 of 46 positions shown, 18 images · IV contrast (Omnipaque or Isovue)
Comparison: CT examination dated October 31, 2017 yes

CLINICAL DATA: Left lower quadrant pain for 5 days.

EXAM:
CT ABDOMEN AND PELVIS WITH CONTRAST
TECHNIQUE: Multidetector CT imaging of the abdomen and pelvis was performed
using the standard protocol following bolus administration of
intravenous contrast.

[Series 2: axial st · axial · 0.83mm/px · z∈[+634,+1054]mm · 13 of 98 slices shown, 15 images]
[im 7/98  soft-tissue]
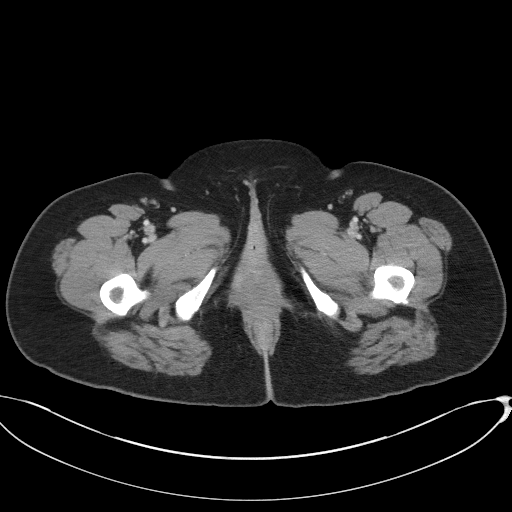
[im 7/98  bone]
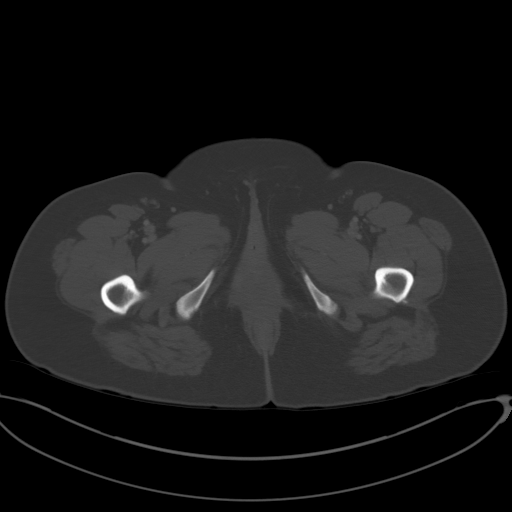
[im 13/98  soft-tissue]
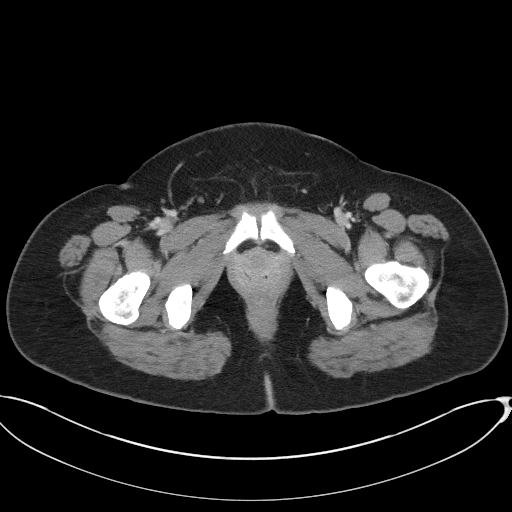
[im 19/98  soft-tissue]
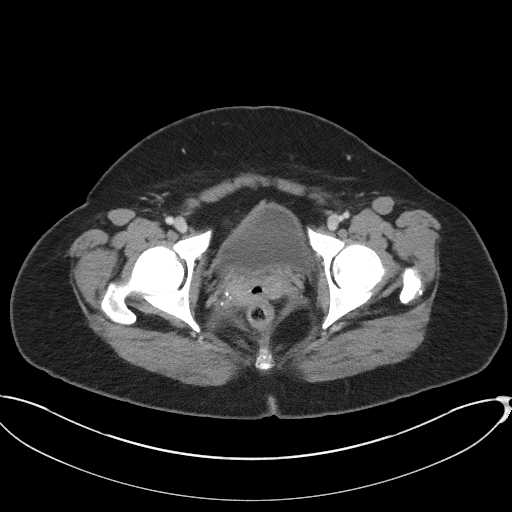
[im 31/98  soft-tissue]
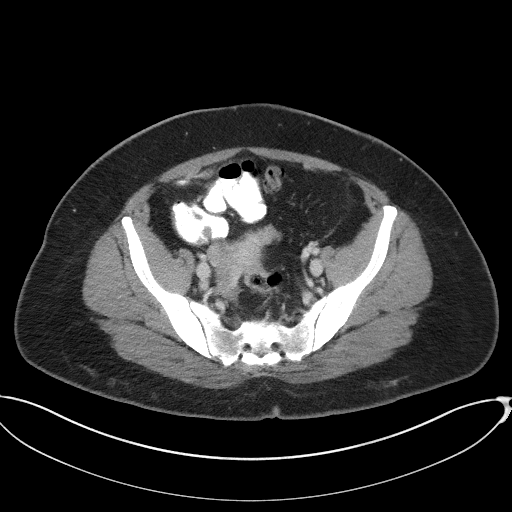
[im 37/98  soft-tissue]
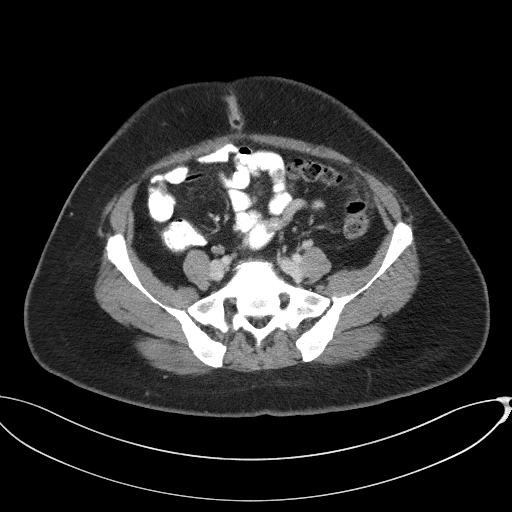
[im 43/98  soft-tissue]
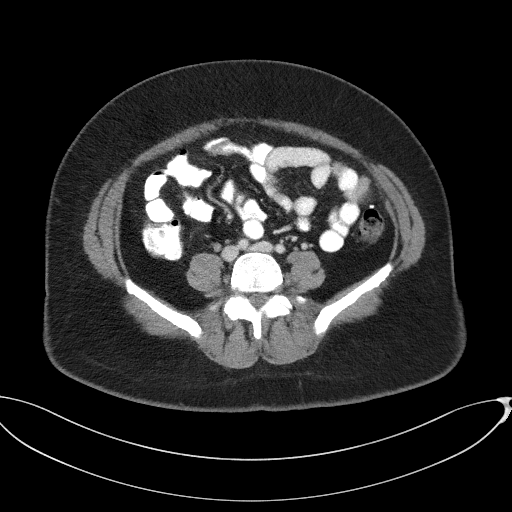
[im 49/98  soft-tissue]
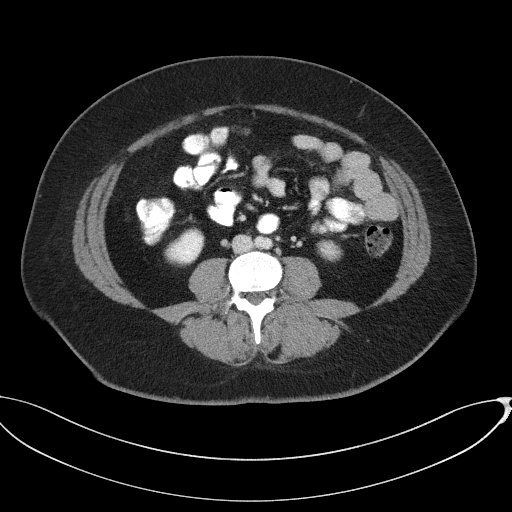
[im 55/98  soft-tissue]
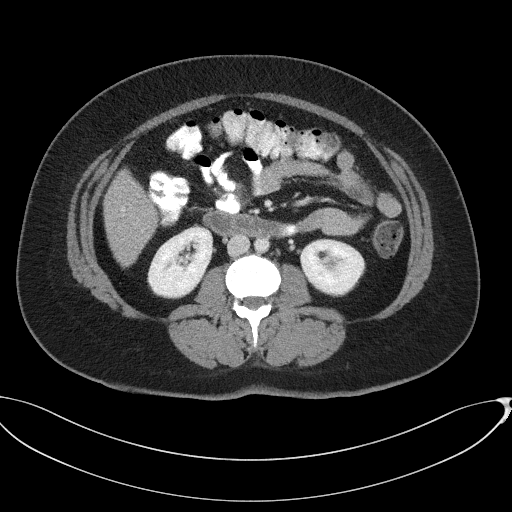
[im 61/98  soft-tissue]
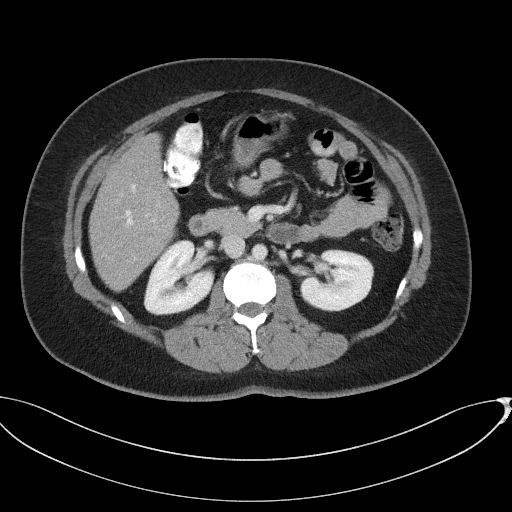
[im 61/98  bone]
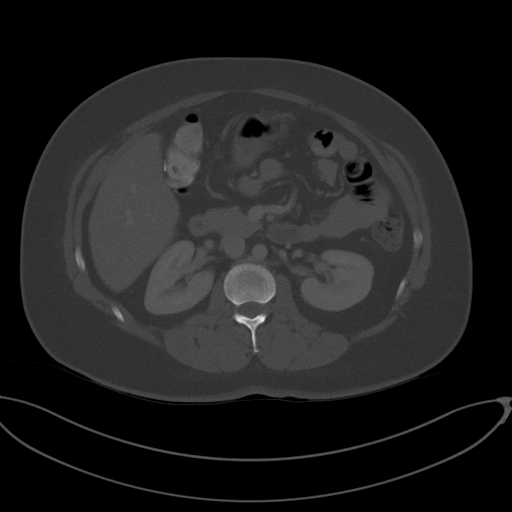
[im 67/98  soft-tissue]
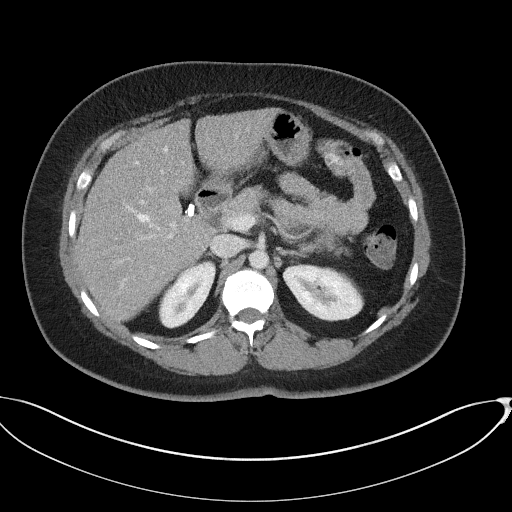
[im 79/98  soft-tissue]
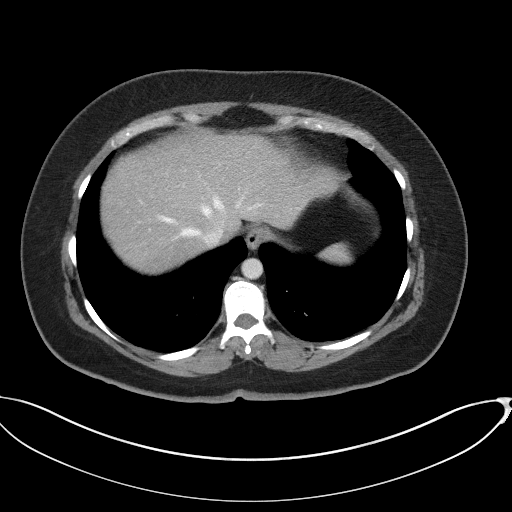
[im 85/98  soft-tissue]
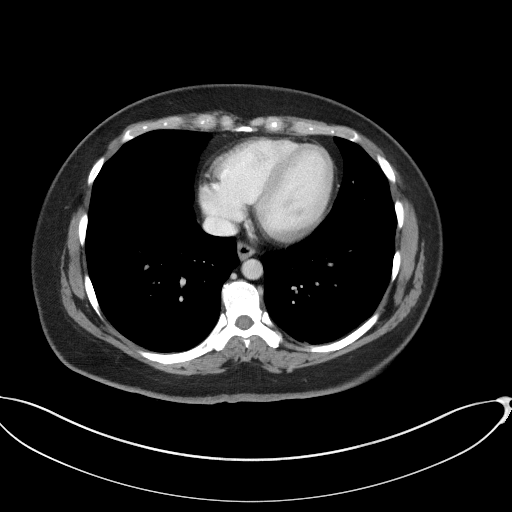
[im 91/98  soft-tissue]
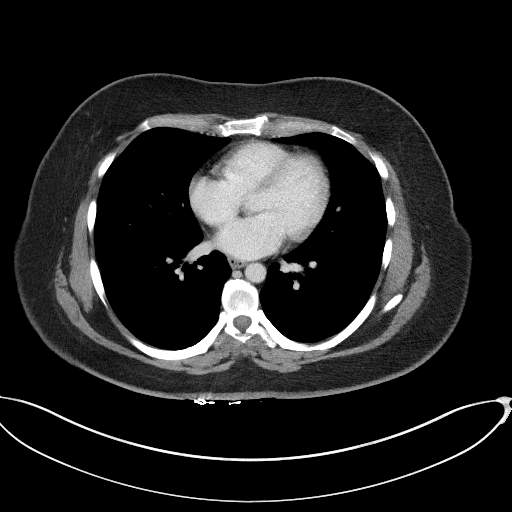

[Series 6: coronal st · coronal · 0.80mm/px · 3 of 115 slices shown]
[im 39/115  soft-tissue]
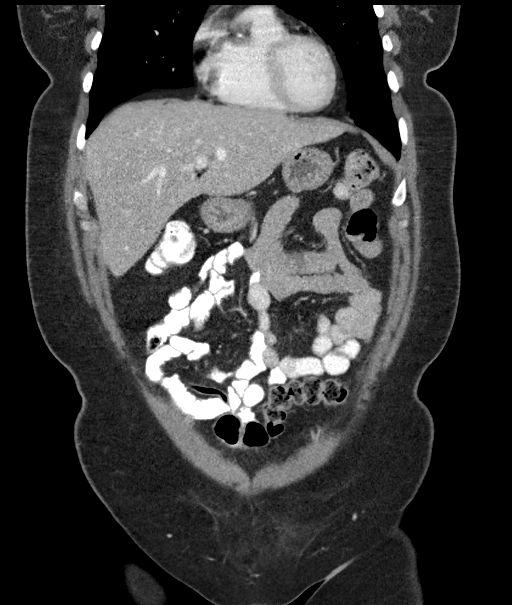
[im 51/115  soft-tissue]
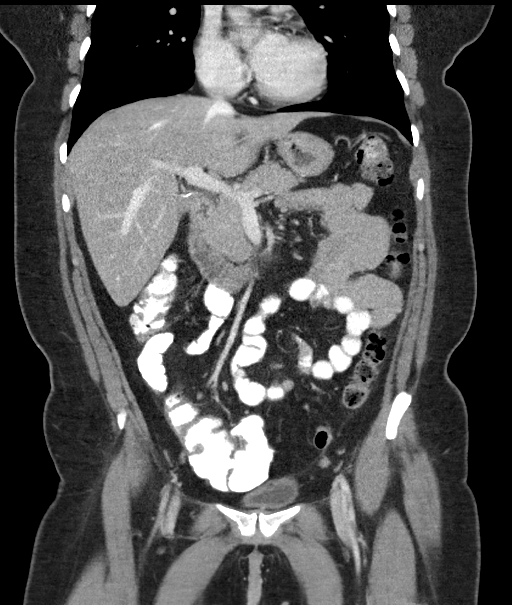
[im 64/115  soft-tissue]
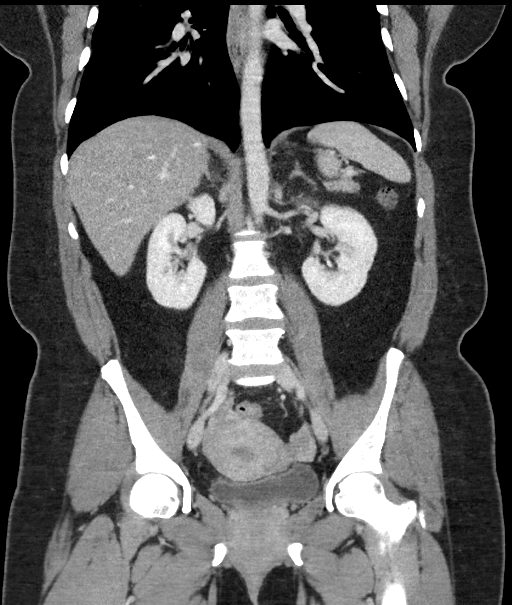

[16 of 46 positions shown; findings below may reference images not displayed]

RADIATION DOSE REDUCTION: This exam was performed according to the
departmental dose-optimization program which includes automated
exposure control, adjustment of the mA and/or kV according to
patient size and/or use of iterative reconstruction technique.

CONTRAST:  100mL OMNIPAQUE IOHEXOL 300 MG/ML  SOLN
FINDINGS: Lower chest: No acute abnormality.

Hepatobiliary: Low attenuation of hepatic parenchyma concerning for
steatosis. Status post cholecystectomy. No biliary ductal
dilatation.

Pancreas: Unremarkable. No pancreatic ductal dilatation or
surrounding inflammatory changes.

Spleen: Normal in size without focal abnormality.

Adrenals/Urinary Tract: Adrenal glands are unremarkable. Kidneys are
normal, without renal calculi, focal lesion, or hydronephrosis.
Bladder is unremarkable.

Stomach/Bowel: Stomach is within normal limits. Appendix appears
normal. No evidence of bowel wall thickening, distention, or
inflammatory changes.

Vascular/Lymphatic: No significant vascular findings are present. No
enlarged abdominal or pelvic lymph nodes.

Reproductive: Uterus and bilateral adnexa are unremarkable. Essure
device adjacent to the left fallopian tube.

Other: There is focal fatty infiltration in the left lower abdominal
quadrant, which may represent epiploic appendagitis or omental
infarct (series 2 image [id number]).

Musculoskeletal: Degenerate disc disease of the lumbar spine at
L5-S1. Lumbarization of the S1.
IMPRESSION: 1. Focal fatty infiltration in the left lower abdominal quadrant
which may represent epiploic appendagitis or omental infarct.

2.  No definite evidence of acute diverticulitis.

3.  Normal appendix.

4.  Status post cholecystectomy.

5.  Hepatic steatosis.

## 2024-03-13 ENCOUNTER — Other Ambulatory Visit (INDEPENDENT_AMBULATORY_CARE_PROVIDER_SITE_OTHER): Payer: Self-pay | Admitting: Gastroenterology

## 2024-03-18 ENCOUNTER — Other Ambulatory Visit (INDEPENDENT_AMBULATORY_CARE_PROVIDER_SITE_OTHER): Payer: Self-pay | Admitting: Gastroenterology

## 2024-06-24 ENCOUNTER — Encounter (INDEPENDENT_AMBULATORY_CARE_PROVIDER_SITE_OTHER): Payer: Self-pay | Admitting: Gastroenterology
